# Patient Record
Sex: Male | Born: 1940 | Race: Black or African American | Hispanic: No | Smoking: Former smoker
Health system: Southern US, Community
[De-identification: ages and names within clinical notes are randomized; demographics above are authoritative.]

## PROBLEM LIST (undated history)

## (undated) DIAGNOSIS — E119 Type 2 diabetes mellitus without complications: Secondary | ICD-10-CM

## (undated) DIAGNOSIS — I10 Essential (primary) hypertension: Secondary | ICD-10-CM

## (undated) DIAGNOSIS — I248 Other forms of acute ischemic heart disease: Secondary | ICD-10-CM

## (undated) DIAGNOSIS — Z950 Presence of cardiac pacemaker: Secondary | ICD-10-CM

## (undated) DIAGNOSIS — N400 Enlarged prostate without lower urinary tract symptoms: Secondary | ICD-10-CM

## (undated) DIAGNOSIS — N289 Disorder of kidney and ureter, unspecified: Secondary | ICD-10-CM

## (undated) DIAGNOSIS — I2489 Other forms of acute ischemic heart disease: Secondary | ICD-10-CM

## (undated) DIAGNOSIS — I340 Nonrheumatic mitral (valve) insufficiency: Secondary | ICD-10-CM

## (undated) DIAGNOSIS — R001 Bradycardia, unspecified: Secondary | ICD-10-CM

## (undated) DIAGNOSIS — I5032 Chronic diastolic (congestive) heart failure: Secondary | ICD-10-CM

## (undated) DIAGNOSIS — I480 Paroxysmal atrial fibrillation: Secondary | ICD-10-CM

## (undated) DIAGNOSIS — I071 Rheumatic tricuspid insufficiency: Secondary | ICD-10-CM

## (undated) DIAGNOSIS — I519 Heart disease, unspecified: Secondary | ICD-10-CM

## (undated) DIAGNOSIS — E7800 Pure hypercholesterolemia, unspecified: Secondary | ICD-10-CM

## (undated) SURGERY — Surgical Case
Anesthesia: *Unknown

---

## 1999-04-02 ENCOUNTER — Ambulatory Visit (HOSPITAL_COMMUNITY): Admission: RE | Admit: 1999-04-02 | Discharge: 1999-04-02 | Payer: Self-pay | Admitting: Urology

## 1999-04-02 ENCOUNTER — Encounter: Payer: Self-pay | Admitting: Urology

## 2001-01-12 ENCOUNTER — Ambulatory Visit (HOSPITAL_COMMUNITY): Admission: RE | Admit: 2001-01-12 | Discharge: 2001-01-12 | Payer: Self-pay | Admitting: Gastroenterology

## 2001-06-08 ENCOUNTER — Encounter: Admission: RE | Admit: 2001-06-08 | Discharge: 2001-09-06 | Payer: Self-pay | Admitting: Internal Medicine

## 2001-08-02 ENCOUNTER — Ambulatory Visit (HOSPITAL_COMMUNITY): Admission: RE | Admit: 2001-08-02 | Discharge: 2001-08-02 | Payer: Self-pay | Admitting: Gastroenterology

## 2003-10-15 ENCOUNTER — Emergency Department (HOSPITAL_COMMUNITY): Admission: EM | Admit: 2003-10-15 | Discharge: 2003-10-15 | Payer: Self-pay | Admitting: Emergency Medicine

## 2011-09-11 DIAGNOSIS — I4891 Unspecified atrial fibrillation: Secondary | ICD-10-CM | POA: Diagnosis not present

## 2011-09-11 DIAGNOSIS — I10 Essential (primary) hypertension: Secondary | ICD-10-CM | POA: Diagnosis not present

## 2011-09-11 DIAGNOSIS — Z7901 Long term (current) use of anticoagulants: Secondary | ICD-10-CM | POA: Diagnosis not present

## 2011-09-18 DIAGNOSIS — Z23 Encounter for immunization: Secondary | ICD-10-CM | POA: Diagnosis not present

## 2011-09-18 DIAGNOSIS — I10 Essential (primary) hypertension: Secondary | ICD-10-CM | POA: Diagnosis not present

## 2011-09-18 DIAGNOSIS — I4891 Unspecified atrial fibrillation: Secondary | ICD-10-CM | POA: Diagnosis not present

## 2011-09-18 DIAGNOSIS — Z7901 Long term (current) use of anticoagulants: Secondary | ICD-10-CM | POA: Diagnosis not present

## 2011-10-13 DIAGNOSIS — I4891 Unspecified atrial fibrillation: Secondary | ICD-10-CM | POA: Diagnosis not present

## 2011-10-13 DIAGNOSIS — Z7901 Long term (current) use of anticoagulants: Secondary | ICD-10-CM | POA: Diagnosis not present

## 2011-11-12 DIAGNOSIS — R972 Elevated prostate specific antigen [PSA]: Secondary | ICD-10-CM | POA: Diagnosis not present

## 2011-11-12 DIAGNOSIS — N401 Enlarged prostate with lower urinary tract symptoms: Secondary | ICD-10-CM | POA: Diagnosis not present

## 2011-11-12 DIAGNOSIS — D303 Benign neoplasm of bladder: Secondary | ICD-10-CM | POA: Diagnosis not present

## 2011-11-25 DIAGNOSIS — R5383 Other fatigue: Secondary | ICD-10-CM | POA: Diagnosis not present

## 2011-11-25 DIAGNOSIS — E119 Type 2 diabetes mellitus without complications: Secondary | ICD-10-CM | POA: Diagnosis not present

## 2011-11-25 DIAGNOSIS — I10 Essential (primary) hypertension: Secondary | ICD-10-CM | POA: Diagnosis not present

## 2011-11-25 DIAGNOSIS — R05 Cough: Secondary | ICD-10-CM | POA: Diagnosis not present

## 2011-11-25 DIAGNOSIS — R5381 Other malaise: Secondary | ICD-10-CM | POA: Diagnosis not present

## 2011-11-30 DIAGNOSIS — Z7901 Long term (current) use of anticoagulants: Secondary | ICD-10-CM | POA: Diagnosis not present

## 2011-11-30 DIAGNOSIS — I4891 Unspecified atrial fibrillation: Secondary | ICD-10-CM | POA: Diagnosis not present

## 2011-12-02 DIAGNOSIS — R5381 Other malaise: Secondary | ICD-10-CM | POA: Diagnosis not present

## 2011-12-02 DIAGNOSIS — E119 Type 2 diabetes mellitus without complications: Secondary | ICD-10-CM | POA: Diagnosis not present

## 2011-12-02 DIAGNOSIS — R5383 Other fatigue: Secondary | ICD-10-CM | POA: Diagnosis not present

## 2011-12-02 DIAGNOSIS — E782 Mixed hyperlipidemia: Secondary | ICD-10-CM | POA: Diagnosis not present

## 2011-12-02 DIAGNOSIS — I4891 Unspecified atrial fibrillation: Secondary | ICD-10-CM | POA: Diagnosis not present

## 2011-12-18 DIAGNOSIS — Z7901 Long term (current) use of anticoagulants: Secondary | ICD-10-CM | POA: Diagnosis not present

## 2011-12-18 DIAGNOSIS — I4891 Unspecified atrial fibrillation: Secondary | ICD-10-CM | POA: Diagnosis not present

## 2012-02-24 DIAGNOSIS — E119 Type 2 diabetes mellitus without complications: Secondary | ICD-10-CM | POA: Diagnosis not present

## 2012-04-01 DIAGNOSIS — Z7901 Long term (current) use of anticoagulants: Secondary | ICD-10-CM | POA: Diagnosis not present

## 2012-04-01 DIAGNOSIS — I4891 Unspecified atrial fibrillation: Secondary | ICD-10-CM | POA: Diagnosis not present

## 2012-04-14 DIAGNOSIS — Z7901 Long term (current) use of anticoagulants: Secondary | ICD-10-CM | POA: Diagnosis not present

## 2012-04-14 DIAGNOSIS — I4891 Unspecified atrial fibrillation: Secondary | ICD-10-CM | POA: Diagnosis not present

## 2012-05-16 DIAGNOSIS — Z7901 Long term (current) use of anticoagulants: Secondary | ICD-10-CM | POA: Diagnosis not present

## 2012-05-16 DIAGNOSIS — I4891 Unspecified atrial fibrillation: Secondary | ICD-10-CM | POA: Diagnosis not present

## 2012-05-24 DIAGNOSIS — I4891 Unspecified atrial fibrillation: Secondary | ICD-10-CM | POA: Diagnosis not present

## 2012-05-24 DIAGNOSIS — K219 Gastro-esophageal reflux disease without esophagitis: Secondary | ICD-10-CM | POA: Diagnosis not present

## 2012-05-24 DIAGNOSIS — Z0000 Encounter for general adult medical examination without abnormal findings: Secondary | ICD-10-CM | POA: Diagnosis not present

## 2012-05-24 DIAGNOSIS — E782 Mixed hyperlipidemia: Secondary | ICD-10-CM | POA: Diagnosis not present

## 2012-05-24 DIAGNOSIS — E119 Type 2 diabetes mellitus without complications: Secondary | ICD-10-CM | POA: Diagnosis not present

## 2012-05-24 DIAGNOSIS — R5381 Other malaise: Secondary | ICD-10-CM | POA: Diagnosis not present

## 2012-05-31 DIAGNOSIS — I4891 Unspecified atrial fibrillation: Secondary | ICD-10-CM | POA: Diagnosis not present

## 2012-05-31 DIAGNOSIS — I10 Essential (primary) hypertension: Secondary | ICD-10-CM | POA: Diagnosis not present

## 2012-05-31 DIAGNOSIS — E119 Type 2 diabetes mellitus without complications: Secondary | ICD-10-CM | POA: Diagnosis not present

## 2012-05-31 DIAGNOSIS — H908 Mixed conductive and sensorineural hearing loss, unspecified: Secondary | ICD-10-CM | POA: Diagnosis not present

## 2012-05-31 DIAGNOSIS — E1129 Type 2 diabetes mellitus with other diabetic kidney complication: Secondary | ICD-10-CM | POA: Diagnosis not present

## 2012-05-31 DIAGNOSIS — E782 Mixed hyperlipidemia: Secondary | ICD-10-CM | POA: Diagnosis not present

## 2012-06-02 DIAGNOSIS — Z7901 Long term (current) use of anticoagulants: Secondary | ICD-10-CM | POA: Diagnosis not present

## 2012-06-02 DIAGNOSIS — I4891 Unspecified atrial fibrillation: Secondary | ICD-10-CM | POA: Diagnosis not present

## 2012-06-02 DIAGNOSIS — I10 Essential (primary) hypertension: Secondary | ICD-10-CM | POA: Diagnosis not present

## 2012-06-23 DIAGNOSIS — I4891 Unspecified atrial fibrillation: Secondary | ICD-10-CM | POA: Diagnosis not present

## 2012-06-23 DIAGNOSIS — I10 Essential (primary) hypertension: Secondary | ICD-10-CM | POA: Diagnosis not present

## 2012-06-23 DIAGNOSIS — Z7901 Long term (current) use of anticoagulants: Secondary | ICD-10-CM | POA: Diagnosis not present

## 2012-10-18 DIAGNOSIS — E119 Type 2 diabetes mellitus without complications: Secondary | ICD-10-CM | POA: Diagnosis not present

## 2012-10-18 DIAGNOSIS — E782 Mixed hyperlipidemia: Secondary | ICD-10-CM | POA: Diagnosis not present

## 2012-10-18 DIAGNOSIS — I10 Essential (primary) hypertension: Secondary | ICD-10-CM | POA: Diagnosis not present

## 2012-10-18 DIAGNOSIS — E1129 Type 2 diabetes mellitus with other diabetic kidney complication: Secondary | ICD-10-CM | POA: Diagnosis not present

## 2012-10-25 DIAGNOSIS — M5430 Sciatica, unspecified side: Secondary | ICD-10-CM | POA: Diagnosis not present

## 2012-10-25 DIAGNOSIS — E119 Type 2 diabetes mellitus without complications: Secondary | ICD-10-CM | POA: Diagnosis not present

## 2012-10-25 DIAGNOSIS — E782 Mixed hyperlipidemia: Secondary | ICD-10-CM | POA: Diagnosis not present

## 2012-10-25 DIAGNOSIS — I10 Essential (primary) hypertension: Secondary | ICD-10-CM | POA: Diagnosis not present

## 2012-11-17 DIAGNOSIS — I10 Essential (primary) hypertension: Secondary | ICD-10-CM | POA: Diagnosis not present

## 2012-12-22 DIAGNOSIS — N3941 Urge incontinence: Secondary | ICD-10-CM | POA: Diagnosis not present

## 2012-12-22 DIAGNOSIS — R972 Elevated prostate specific antigen [PSA]: Secondary | ICD-10-CM | POA: Diagnosis not present

## 2012-12-22 DIAGNOSIS — N401 Enlarged prostate with lower urinary tract symptoms: Secondary | ICD-10-CM | POA: Diagnosis not present

## 2012-12-22 DIAGNOSIS — D303 Benign neoplasm of bladder: Secondary | ICD-10-CM | POA: Diagnosis not present

## 2013-03-07 DIAGNOSIS — M5430 Sciatica, unspecified side: Secondary | ICD-10-CM | POA: Diagnosis not present

## 2013-03-07 DIAGNOSIS — I10 Essential (primary) hypertension: Secondary | ICD-10-CM | POA: Diagnosis not present

## 2013-03-07 DIAGNOSIS — E119 Type 2 diabetes mellitus without complications: Secondary | ICD-10-CM | POA: Diagnosis not present

## 2013-03-07 DIAGNOSIS — E782 Mixed hyperlipidemia: Secondary | ICD-10-CM | POA: Diagnosis not present

## 2013-03-14 DIAGNOSIS — E782 Mixed hyperlipidemia: Secondary | ICD-10-CM | POA: Diagnosis not present

## 2013-03-14 DIAGNOSIS — E119 Type 2 diabetes mellitus without complications: Secondary | ICD-10-CM | POA: Diagnosis not present

## 2013-03-14 DIAGNOSIS — I10 Essential (primary) hypertension: Secondary | ICD-10-CM | POA: Diagnosis not present

## 2013-03-14 DIAGNOSIS — I4891 Unspecified atrial fibrillation: Secondary | ICD-10-CM | POA: Diagnosis not present

## 2013-08-02 DIAGNOSIS — Z7901 Long term (current) use of anticoagulants: Secondary | ICD-10-CM | POA: Diagnosis not present

## 2013-08-02 DIAGNOSIS — I10 Essential (primary) hypertension: Secondary | ICD-10-CM | POA: Diagnosis not present

## 2013-08-02 DIAGNOSIS — Z0000 Encounter for general adult medical examination without abnormal findings: Secondary | ICD-10-CM | POA: Diagnosis not present

## 2013-08-02 DIAGNOSIS — Z1331 Encounter for screening for depression: Secondary | ICD-10-CM | POA: Diagnosis not present

## 2013-08-10 DIAGNOSIS — E782 Mixed hyperlipidemia: Secondary | ICD-10-CM | POA: Diagnosis not present

## 2013-08-10 DIAGNOSIS — E1129 Type 2 diabetes mellitus with other diabetic kidney complication: Secondary | ICD-10-CM | POA: Diagnosis not present

## 2013-08-10 DIAGNOSIS — Z0000 Encounter for general adult medical examination without abnormal findings: Secondary | ICD-10-CM | POA: Diagnosis not present

## 2013-08-10 DIAGNOSIS — I10 Essential (primary) hypertension: Secondary | ICD-10-CM | POA: Diagnosis not present

## 2013-08-10 DIAGNOSIS — E119 Type 2 diabetes mellitus without complications: Secondary | ICD-10-CM | POA: Diagnosis not present

## 2013-09-20 DIAGNOSIS — B353 Tinea pedis: Secondary | ICD-10-CM | POA: Diagnosis not present

## 2013-09-20 DIAGNOSIS — Q6689 Other  specified congenital deformities of feet: Secondary | ICD-10-CM | POA: Diagnosis not present

## 2013-09-20 DIAGNOSIS — E119 Type 2 diabetes mellitus without complications: Secondary | ICD-10-CM | POA: Diagnosis not present

## 2013-10-03 DIAGNOSIS — I10 Essential (primary) hypertension: Secondary | ICD-10-CM | POA: Diagnosis not present

## 2013-10-03 DIAGNOSIS — Z7901 Long term (current) use of anticoagulants: Secondary | ICD-10-CM | POA: Diagnosis not present

## 2013-10-03 DIAGNOSIS — I4891 Unspecified atrial fibrillation: Secondary | ICD-10-CM | POA: Diagnosis not present

## 2013-10-12 DIAGNOSIS — I4891 Unspecified atrial fibrillation: Secondary | ICD-10-CM | POA: Diagnosis not present

## 2013-10-12 DIAGNOSIS — I10 Essential (primary) hypertension: Secondary | ICD-10-CM | POA: Diagnosis not present

## 2013-10-12 DIAGNOSIS — Z7901 Long term (current) use of anticoagulants: Secondary | ICD-10-CM | POA: Diagnosis not present

## 2013-11-27 DIAGNOSIS — IMO0001 Reserved for inherently not codable concepts without codable children: Secondary | ICD-10-CM | POA: Diagnosis not present

## 2013-11-27 DIAGNOSIS — I10 Essential (primary) hypertension: Secondary | ICD-10-CM | POA: Diagnosis not present

## 2013-11-27 DIAGNOSIS — Z7901 Long term (current) use of anticoagulants: Secondary | ICD-10-CM | POA: Diagnosis not present

## 2013-11-27 DIAGNOSIS — I4891 Unspecified atrial fibrillation: Secondary | ICD-10-CM | POA: Diagnosis not present

## 2013-12-13 DIAGNOSIS — Z7901 Long term (current) use of anticoagulants: Secondary | ICD-10-CM | POA: Diagnosis not present

## 2013-12-13 DIAGNOSIS — E119 Type 2 diabetes mellitus without complications: Secondary | ICD-10-CM | POA: Diagnosis not present

## 2013-12-14 DIAGNOSIS — Z7901 Long term (current) use of anticoagulants: Secondary | ICD-10-CM | POA: Diagnosis not present

## 2013-12-14 DIAGNOSIS — I4891 Unspecified atrial fibrillation: Secondary | ICD-10-CM | POA: Diagnosis not present

## 2013-12-20 DIAGNOSIS — I4891 Unspecified atrial fibrillation: Secondary | ICD-10-CM | POA: Diagnosis not present

## 2013-12-20 DIAGNOSIS — IMO0001 Reserved for inherently not codable concepts without codable children: Secondary | ICD-10-CM | POA: Diagnosis not present

## 2013-12-20 DIAGNOSIS — I10 Essential (primary) hypertension: Secondary | ICD-10-CM | POA: Diagnosis not present

## 2013-12-20 DIAGNOSIS — E1129 Type 2 diabetes mellitus with other diabetic kidney complication: Secondary | ICD-10-CM | POA: Diagnosis not present

## 2013-12-26 DIAGNOSIS — M2040 Other hammer toe(s) (acquired), unspecified foot: Secondary | ICD-10-CM | POA: Diagnosis not present

## 2013-12-28 DIAGNOSIS — I10 Essential (primary) hypertension: Secondary | ICD-10-CM | POA: Diagnosis not present

## 2013-12-28 DIAGNOSIS — Z7901 Long term (current) use of anticoagulants: Secondary | ICD-10-CM | POA: Diagnosis not present

## 2013-12-28 DIAGNOSIS — I4891 Unspecified atrial fibrillation: Secondary | ICD-10-CM | POA: Diagnosis not present

## 2014-01-04 DIAGNOSIS — I4891 Unspecified atrial fibrillation: Secondary | ICD-10-CM | POA: Diagnosis not present

## 2014-01-04 DIAGNOSIS — Z7901 Long term (current) use of anticoagulants: Secondary | ICD-10-CM | POA: Diagnosis not present

## 2014-01-04 DIAGNOSIS — I10 Essential (primary) hypertension: Secondary | ICD-10-CM | POA: Diagnosis not present

## 2014-01-29 ENCOUNTER — Emergency Department (HOSPITAL_COMMUNITY): Payer: Medicare Other

## 2014-01-29 ENCOUNTER — Emergency Department (HOSPITAL_COMMUNITY)
Admission: EM | Admit: 2014-01-29 | Discharge: 2014-01-29 | Disposition: A | Discharge status: Home / Self Care | Payer: Medicare Other | Attending: Emergency Medicine | Admitting: Emergency Medicine

## 2014-01-29 ENCOUNTER — Encounter (HOSPITAL_COMMUNITY): Payer: Self-pay | Admitting: Emergency Medicine

## 2014-01-29 DIAGNOSIS — M11239 Other chondrocalcinosis, unspecified wrist: Secondary | ICD-10-CM | POA: Diagnosis not present

## 2014-01-29 DIAGNOSIS — E119 Type 2 diabetes mellitus without complications: Secondary | ICD-10-CM | POA: Diagnosis not present

## 2014-01-29 DIAGNOSIS — M25539 Pain in unspecified wrist: Secondary | ICD-10-CM | POA: Insufficient documentation

## 2014-01-29 DIAGNOSIS — Z87891 Personal history of nicotine dependence: Secondary | ICD-10-CM | POA: Diagnosis not present

## 2014-01-29 DIAGNOSIS — M25549 Pain in joints of unspecified hand: Secondary | ICD-10-CM | POA: Insufficient documentation

## 2014-01-29 DIAGNOSIS — M79641 Pain in right hand: Secondary | ICD-10-CM

## 2014-01-29 DIAGNOSIS — I10 Essential (primary) hypertension: Secondary | ICD-10-CM | POA: Insufficient documentation

## 2014-01-29 DIAGNOSIS — M19049 Primary osteoarthritis, unspecified hand: Secondary | ICD-10-CM | POA: Diagnosis not present

## 2014-01-29 DIAGNOSIS — M25449 Effusion, unspecified hand: Secondary | ICD-10-CM | POA: Insufficient documentation

## 2014-01-29 DIAGNOSIS — M25439 Effusion, unspecified wrist: Secondary | ICD-10-CM | POA: Insufficient documentation

## 2014-01-29 DIAGNOSIS — M25531 Pain in right wrist: Secondary | ICD-10-CM

## 2014-01-29 HISTORY — DX: Essential (primary) hypertension: I10

## 2014-01-29 HISTORY — DX: Type 2 diabetes mellitus without complications: E11.9

## 2014-01-29 HISTORY — DX: Pure hypercholesterolemia, unspecified: E78.00

## 2014-01-29 LAB — CBG MONITORING, ED: GLUCOSE-CAPILLARY: 138 mg/dL — AB (ref 70–99)

## 2014-01-29 MED ORDER — HYDROCODONE-ACETAMINOPHEN 5-325 MG PO TABS: 1.0000 | ORAL_TABLET | Freq: Four times a day (QID) | ORAL | Status: DC | PRN

## 2014-01-29 MED ORDER — TRAMADOL HCL 50 MG PO TABS: 50.0000 mg | ORAL_TABLET | Freq: Four times a day (QID) | ORAL | Status: DC | PRN

## 2014-01-29 NOTE — ED Notes (Signed)
Ortho tech at bedside to place thumb spica

## 2014-01-29 NOTE — Discharge Instructions (Signed)
Please call your doctor for a followup appointment within 24-48 hours. When you talk to your doctor please let them know that you were seen in the emergency department and have them acquire all of your records so that they can discuss the findings with you and formulate a treatment plan to fully care for your new and ongoing problems. Please call and set-up an appointment with hand specialist. Discussed Please avoid any physical or strenuous activity Please take pain medications as prescribed rash on pain medications his be no drinking alcohol, driving, operating any heavy machinery if there is extra please disposer proper manner. Please do not take any extra Tylenol for this can lead to Tylenol overdose and liver failure. Please rest, ice, elevate Please continue monitor symptoms closely and if symptoms are to worsen or change (fever greater than 1, chills, chest pain, shortness of breath, difficulty breathing, numbness, tingling, worsening or changes to pain pattern, red streaks running up the hands, swelling, to the touch, redness to the skin, weakness, fall, injury) please report back to the ED immediately   Wrist Pain Wrist injuries are frequent in adults and children. A sprain is an injury to the ligaments that hold your bones together. A strain is an injury to muscle or muscle cord-like structures (tendons) from stretching or pulling. Generally, when wrists are moderately tender to touch following a fall or injury, a break in the bone (fracture) may be present. Most wrist sprains or strains are better in 3 to 5 days, but complete healing may take several weeks. HOME CARE INSTRUCTIONS   Put ice on the injured area.  Put ice in a plastic bag.  Place a towel between your skin and the bag.  Leave the ice on for 15-20 minutes, 03-04 times a day, for the first 2 days.  Keep your arm raised above the level of your heart whenever possible to reduce swelling and pain.  Rest the injured area for at  least 48 hours or as directed by your caregiver.  If a splint or elastic bandage has been applied, use it for as long as directed by your caregiver or until seen by a caregiver for a follow-up exam.  Only take over-the-counter or prescription medicines for pain, discomfort, or fever as directed by your caregiver.  Keep all follow-up appointments. You may need to follow up with a specialist or have follow-up X-rays. Improvement in pain level is not a guarantee that you did not fracture a bone in your wrist. The only way to determine whether or not you have a broken bone is by X-ray. SEEK IMMEDIATE MEDICAL CARE IF:   Your fingers are swollen, very red, white, or cold and blue.  Your fingers are numb or tingling.  You have increasing pain.  You have difficulty moving your fingers. MAKE SURE YOU:   Understand these instructions.  Will watch your condition.  Will get help right away if you are not doing well or get worse. Document Released: 06/03/2005 Document Revised: 11/16/2011 Document Reviewed: 10/15/2010 [facility] Patient Information 2014 [place].

## 2014-01-29 NOTE — ED Notes (Signed)
Pt A+Ox4, presents with c/o R wrist pain and swelling to palm of hand.  Pt reports swelling x2 months, but pain started yesterday "i was lifting stuff".  Pt reports taking a vicodin last night with no relief.  Pt denies n/t to extremity.  +csm/+pulses.  Swelling noted to thumb area.  Pt denies other complaints.  NAD.

## 2014-01-29 NOTE — ED Provider Notes (Signed)
CSN: [id number]     Arrival date & time 01/29/14  1109 History   First MD Initiated Contact with Patient 01/29/14 1124     Chief Complaint  Patient presents with  . Wrist Pain     (Consider location/radiation/quality/duration/timing/severity/associated sxs/prior Treatment) The history is provided by the patient. No language interpreter was used.  Nicholas Harris is a 73 year old male with past medical history of diabetes, hypertension, hypercholesterolemia, atrial fibrillation on anticoagulation therapy presenting to the ED with right hand and wrist pain that started approximately 9:30 PM last night. Patient reports that over the past 2 months patient has been experiencing swelling to his right wrist and hand with intermittent tingling. Reported that pain worsens last night described as a dull, aching, throbbing sensation radiating from the right wrist down to the digits of the right hand. Stated that there is increased pain with motion as well as decreased grip secondary to pain. Stated that the pain worsens secondary to lifting buckets of water yesterday and transferring to vehicle. Reported that he took a Vicodin yesterday with minimal relief. Denied fall, injuries, trauma, numbness, tingling, weakness, red streaks, fever, chills. PCP Dr. Junius Creamer  Past Medical History  Diagnosis Date  . Diabetes mellitus without complication   . Hypertension   . High cholesterol   . A-fib    History reviewed. No pertinent past surgical history. No family history on file. History  Substance Use Topics  . Smoking status: Former Research scientist (life sciences)  . Smokeless tobacco: Not on file  . Alcohol Use: Yes     Comment: 2-3/wk    Review of Systems  Constitutional: Negative for fever and chills.  Musculoskeletal: Positive for arthralgias (Right hand and wrist pain) and joint swelling.  Neurological: Negative for weakness and numbness.  All other systems reviewed and are negative.     Allergies  Review of  patient's allergies indicates no known allergies.  Home Medications   Prior to Admission medications   Not on File   BP 139/77  Pulse 61  Temp(Src) 98.8 F (37.1 C) (Oral)  Resp 18  Ht 5\' 11"  (1.803 m)  Wt 250 lb (113.399 kg)  BMI 34.88 kg/m2  SpO2 99% Physical Exam  Nursing note and vitals reviewed. Constitutional: He is oriented to person, place, and time. He appears well-developed and well-nourished. No distress.  HENT:  Head: Normocephalic and atraumatic.  Eyes: Conjunctivae and EOM are normal. Right eye exhibits no discharge. Left eye exhibits no discharge.  Neck: Normal range of motion. Neck supple. No tracheal deviation present.  Cardiovascular: Normal rate, regular rhythm and normal heart sounds.  Exam reveals no friction rub.   No murmur heard. Pulses:      Radial pulses are 2+ on the right side, and 2+ on the left side.  Pulmonary/Chest: Effort normal and breath sounds normal. No respiratory distress. He has no wheezes. He has no rales.  Musculoskeletal: He exhibits edema and tenderness.       Right wrist: He exhibits decreased range of motion, tenderness and swelling. He exhibits no bony tenderness, no effusion, no crepitus, no deformity and no laceration.       Arms: Swelling noted to the dorsal aspect of the right hand and thenar region. Discomfort upon palpation to the thenar region of the right hand, positive snuffbox tenderness. Discomfort upon palpation to the right wrist circumferentially. Decreased ROM to the right wrist and digits of the right hand secondary to pain. Inability to make a fist secondary to pain. Pain  with flexion and extension of the right wrist. Pain with supination and pronation. Deformities identified to the right wrist.  Lymphadenopathy:    He has no cervical adenopathy.  Neurological: He is alert and oriented to person, place, and time. No cranial nerve deficit. He exhibits normal muscle tone. Coordination normal.  Skin: Skin is warm and dry.  No rash noted. He is not diaphoretic. No erythema.  Psychiatric: He has a normal mood and affect. His behavior is normal. Thought content normal.    ED Course  Procedures (including critical care time)  12:43 PM Dr. Tyrone Apple in-attending physician-in room assessing patient. As per attending physician recommended thumb spica splint and in consult to be performed.  1:01 PM This provider spoke with Dr. Lenon Curt - hand specialist - discussed case, physical exam, imaging. Recommended patient to be seen as outpatient. Agree to thumb spica.  Results for orders placed during the hospital encounter of 01/29/14  CBG MONITORING, ED      Result Value Ref Range   Glucose-Capillary 138 (*) 70 - 99 mg/dL     Labs Review Labs Reviewed  CBG MONITORING, ED - Abnormal; Notable for the following:    Glucose-Capillary 138 (*)    All other components within normal limits    Imaging Review Dg Wrist Complete Right  01/29/2014   CLINICAL DATA:  WRIST PAIN  EXAM: RIGHT WRIST - COMPLETE 3+ VIEW  COMPARISON:  None.  FINDINGS: Diffuse osteopenia. Carpal rows intact. Chondrocalcinosis in the TFCC. Negative for fracture or dislocation. No significant osseous degenerative change. Radial arterial calcifications noted at the wrist.  IMPRESSION: 1. Negative for fracture or other acute bone abnormality. 2. 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   Electronically Signed   By: Arne Cleveland M.D.   On: 01/29/2014 11:48   Dg Hand Complete Right  01/29/2014   CLINICAL DATA:  WRIST PAIN, no definite trauma  EXAM: RIGHT HAND - COMPLETE 3+ VIEW  COMPARISON:  None.  FINDINGS: Mild diffuse osteopenia. Normal alignment. Carpal rows intact. Chondrocalcinosis in the TFCC. Early marginal spurs about the DIP and PIP of the index finger. Vascular calcifications noted. Negative for fracture. No dislocation.  IMPRESSION: 1. Negative for fracture or other acute bone abnormality. 2. 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 3. 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   Electronically Signed   By: Arne Cleveland M.D.   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 Right hand plain film identified mild osteophyte as the DIP and PIP of the index finger. Decreased flexion and extension of the right wrist secondary to pain. Decreased range of motion to the digits secondary to pain. Thenar swelling with pain upon palpation-positive snuffbox tenderness. Patient seen and assessed by attending physician, Dr. Julious Oka who recommended patient to be placed in thumb spica. This provider spoke with hand specialist who agreed to thumb spica splint and recommended patient to be followed up as an outpatient. Snuffbox  tenderness identified to the right wrist. Decreased range of motion to the digits of the right hand secondary to pain. Decreased range of motion to the right wrist secondary to pain. Sensation intact. Patient neurovascularly intact. Strength intact MCP, PIP, DIP joints of the right hand. Pulses palpable and strong. Patient placed in thumb spica splint. Doubt septic joint. Doubt gout. Suspicion to be possible wrist sprain or tendon injury secondary to lifting of water yesterday. Patient seen and assessed by attending physician. Patient stable, afebrile. Discharged patient. Discharged patient with a small dose of pain medications. Referred patient to hand specialist and primary care provider. Discussed with patient to rest, ice, elevate. Discussed with patient to closely monitor symptoms and if symptoms are to  worsen or change to report back to the ED - strict return instructions given.  Patient agreed to plan of care, understood, all questions answered.   Jamse Mead, PA-C 01/29/14 2210

## 2014-01-30 NOTE — ED Provider Notes (Signed)
Medical screening examination/treatment/procedure(s) were conducted as a shared visit with non-physician practitioner(s) and myself.  I personally evaluated the patient during the encounter.   EKG Interpretation None      Patient here with R hand pain. Chronic thenar swelling, had R hand pain worsen after picking up a heavy object yesterday. Now with snuffbox tenderness, difficulty with wrist extension, wrist flexion, thumb adduction. He has poor effort with exam. Xray negative. With acuity of injury, no systemic symptoms, no fevers, no vomiting, no effusion - no concern for gout or septic arthritis. Instructed to f/u with Hand Surgery.  Osvaldo Shipper, MD 01/30/14 [phone]

## 2014-02-05 DIAGNOSIS — I4891 Unspecified atrial fibrillation: Secondary | ICD-10-CM | POA: Diagnosis not present

## 2014-02-05 DIAGNOSIS — Z7901 Long term (current) use of anticoagulants: Secondary | ICD-10-CM | POA: Diagnosis not present

## 2014-02-05 DIAGNOSIS — I10 Essential (primary) hypertension: Secondary | ICD-10-CM | POA: Diagnosis not present

## 2014-02-09 DIAGNOSIS — M25549 Pain in joints of unspecified hand: Secondary | ICD-10-CM | POA: Diagnosis not present

## 2014-03-08 DIAGNOSIS — I4891 Unspecified atrial fibrillation: Secondary | ICD-10-CM | POA: Diagnosis not present

## 2014-03-08 DIAGNOSIS — Z7901 Long term (current) use of anticoagulants: Secondary | ICD-10-CM | POA: Diagnosis not present

## 2014-03-13 DIAGNOSIS — N3941 Urge incontinence: Secondary | ICD-10-CM | POA: Diagnosis not present

## 2014-03-13 DIAGNOSIS — N138 Other obstructive and reflux uropathy: Secondary | ICD-10-CM | POA: Diagnosis not present

## 2014-03-13 DIAGNOSIS — N529 Male erectile dysfunction, unspecified: Secondary | ICD-10-CM | POA: Diagnosis not present

## 2014-03-13 DIAGNOSIS — N401 Enlarged prostate with lower urinary tract symptoms: Secondary | ICD-10-CM | POA: Diagnosis not present

## 2014-03-27 DIAGNOSIS — Z7901 Long term (current) use of anticoagulants: Secondary | ICD-10-CM | POA: Diagnosis not present

## 2014-03-27 DIAGNOSIS — I4891 Unspecified atrial fibrillation: Secondary | ICD-10-CM | POA: Diagnosis not present

## 2014-04-03 DIAGNOSIS — K5730 Diverticulosis of large intestine without perforation or abscess without bleeding: Secondary | ICD-10-CM | POA: Diagnosis not present

## 2014-04-03 DIAGNOSIS — Z1211 Encounter for screening for malignant neoplasm of colon: Secondary | ICD-10-CM | POA: Diagnosis not present

## 2014-04-03 DIAGNOSIS — K219 Gastro-esophageal reflux disease without esophagitis: Secondary | ICD-10-CM | POA: Diagnosis not present

## 2014-04-26 DIAGNOSIS — I4891 Unspecified atrial fibrillation: Secondary | ICD-10-CM | POA: Diagnosis not present

## 2014-04-26 DIAGNOSIS — Z7901 Long term (current) use of anticoagulants: Secondary | ICD-10-CM | POA: Diagnosis not present

## 2014-04-30 DIAGNOSIS — E782 Mixed hyperlipidemia: Secondary | ICD-10-CM | POA: Diagnosis not present

## 2014-04-30 DIAGNOSIS — IMO0001 Reserved for inherently not codable concepts without codable children: Secondary | ICD-10-CM | POA: Diagnosis not present

## 2014-05-02 DIAGNOSIS — I4891 Unspecified atrial fibrillation: Secondary | ICD-10-CM | POA: Diagnosis not present

## 2014-05-02 DIAGNOSIS — IMO0001 Reserved for inherently not codable concepts without codable children: Secondary | ICD-10-CM | POA: Diagnosis not present

## 2014-05-02 DIAGNOSIS — I10 Essential (primary) hypertension: Secondary | ICD-10-CM | POA: Diagnosis not present

## 2014-05-02 DIAGNOSIS — E782 Mixed hyperlipidemia: Secondary | ICD-10-CM | POA: Diagnosis not present

## 2014-05-29 DIAGNOSIS — Z7901 Long term (current) use of anticoagulants: Secondary | ICD-10-CM | POA: Diagnosis not present

## 2014-05-29 DIAGNOSIS — I4891 Unspecified atrial fibrillation: Secondary | ICD-10-CM | POA: Diagnosis not present

## 2014-06-08 DIAGNOSIS — Z1211 Encounter for screening for malignant neoplasm of colon: Secondary | ICD-10-CM | POA: Diagnosis not present

## 2014-06-08 DIAGNOSIS — K5730 Diverticulosis of large intestine without perforation or abscess without bleeding: Secondary | ICD-10-CM | POA: Diagnosis not present

## 2014-06-25 DIAGNOSIS — I4891 Unspecified atrial fibrillation: Secondary | ICD-10-CM | POA: Diagnosis not present

## 2014-06-25 DIAGNOSIS — Z7901 Long term (current) use of anticoagulants: Secondary | ICD-10-CM | POA: Diagnosis not present

## 2014-07-23 DIAGNOSIS — Z7901 Long term (current) use of anticoagulants: Secondary | ICD-10-CM | POA: Diagnosis not present

## 2014-07-23 DIAGNOSIS — I4891 Unspecified atrial fibrillation: Secondary | ICD-10-CM | POA: Diagnosis not present

## 2014-09-11 DIAGNOSIS — Z7901 Long term (current) use of anticoagulants: Secondary | ICD-10-CM | POA: Diagnosis not present

## 2014-09-11 DIAGNOSIS — I4891 Unspecified atrial fibrillation: Secondary | ICD-10-CM | POA: Diagnosis not present

## 2014-09-19 DIAGNOSIS — Z0000 Encounter for general adult medical examination without abnormal findings: Secondary | ICD-10-CM | POA: Diagnosis not present

## 2014-09-19 DIAGNOSIS — Z1389 Encounter for screening for other disorder: Secondary | ICD-10-CM | POA: Diagnosis not present

## 2014-09-24 DIAGNOSIS — E782 Mixed hyperlipidemia: Secondary | ICD-10-CM | POA: Diagnosis not present

## 2014-09-24 DIAGNOSIS — E1165 Type 2 diabetes mellitus with hyperglycemia: Secondary | ICD-10-CM | POA: Diagnosis not present

## 2014-09-24 DIAGNOSIS — I10 Essential (primary) hypertension: Secondary | ICD-10-CM | POA: Diagnosis not present

## 2014-09-24 DIAGNOSIS — N390 Urinary tract infection, site not specified: Secondary | ICD-10-CM | POA: Diagnosis not present

## 2014-09-24 DIAGNOSIS — M5430 Sciatica, unspecified side: Secondary | ICD-10-CM | POA: Diagnosis not present

## 2014-10-09 DIAGNOSIS — Z7901 Long term (current) use of anticoagulants: Secondary | ICD-10-CM | POA: Diagnosis not present

## 2014-10-09 DIAGNOSIS — I4891 Unspecified atrial fibrillation: Secondary | ICD-10-CM | POA: Diagnosis not present

## 2014-10-09 DIAGNOSIS — E559 Vitamin D deficiency, unspecified: Secondary | ICD-10-CM | POA: Diagnosis not present

## 2014-10-10 DIAGNOSIS — I10 Essential (primary) hypertension: Secondary | ICD-10-CM | POA: Diagnosis not present

## 2014-10-10 DIAGNOSIS — E119 Type 2 diabetes mellitus without complications: Secondary | ICD-10-CM | POA: Diagnosis not present

## 2014-10-10 DIAGNOSIS — H11159 Pinguecula, unspecified eye: Secondary | ICD-10-CM | POA: Diagnosis not present

## 2014-10-10 DIAGNOSIS — H25099 Other age-related incipient cataract, unspecified eye: Secondary | ICD-10-CM | POA: Diagnosis not present

## 2014-10-29 DIAGNOSIS — E1121 Type 2 diabetes mellitus with diabetic nephropathy: Secondary | ICD-10-CM | POA: Diagnosis not present

## 2014-10-29 DIAGNOSIS — E782 Mixed hyperlipidemia: Secondary | ICD-10-CM | POA: Diagnosis not present

## 2014-10-29 DIAGNOSIS — E1165 Type 2 diabetes mellitus with hyperglycemia: Secondary | ICD-10-CM | POA: Diagnosis not present

## 2014-10-29 DIAGNOSIS — I10 Essential (primary) hypertension: Secondary | ICD-10-CM | POA: Diagnosis not present

## 2014-11-06 DIAGNOSIS — I4891 Unspecified atrial fibrillation: Secondary | ICD-10-CM | POA: Diagnosis not present

## 2014-11-06 DIAGNOSIS — Z7901 Long term (current) use of anticoagulants: Secondary | ICD-10-CM | POA: Diagnosis not present

## 2014-11-08 DIAGNOSIS — Z202 Contact with and (suspected) exposure to infections with a predominantly sexual mode of transmission: Secondary | ICD-10-CM | POA: Diagnosis not present

## 2014-11-08 DIAGNOSIS — J060 Acute laryngopharyngitis: Secondary | ICD-10-CM | POA: Diagnosis not present

## 2014-11-08 DIAGNOSIS — I10 Essential (primary) hypertension: Secondary | ICD-10-CM | POA: Diagnosis not present

## 2014-12-06 DIAGNOSIS — Z7901 Long term (current) use of anticoagulants: Secondary | ICD-10-CM | POA: Diagnosis not present

## 2014-12-06 DIAGNOSIS — I4891 Unspecified atrial fibrillation: Secondary | ICD-10-CM | POA: Diagnosis not present

## 2014-12-06 DIAGNOSIS — E559 Vitamin D deficiency, unspecified: Secondary | ICD-10-CM | POA: Diagnosis not present

## 2015-02-20 ENCOUNTER — Emergency Department (HOSPITAL_COMMUNITY)
Admission: EM | Admit: 2015-02-20 | Discharge: 2015-02-20 | Disposition: A | Discharge status: Home / Self Care | Payer: Self-pay

## 2015-02-20 ENCOUNTER — Emergency Department (HOSPITAL_COMMUNITY)
Admission: EM | Admit: 2015-02-20 | Discharge: 2015-02-21 | Disposition: A | Discharge status: Home / Self Care | Payer: Medicare Other | Attending: Emergency Medicine | Admitting: Emergency Medicine

## 2015-02-20 ENCOUNTER — Emergency Department (HOSPITAL_COMMUNITY): Payer: Medicare Other

## 2015-02-20 ENCOUNTER — Encounter (HOSPITAL_COMMUNITY): Payer: Self-pay | Admitting: Emergency Medicine

## 2015-02-20 DIAGNOSIS — M47816 Spondylosis without myelopathy or radiculopathy, lumbar region: Secondary | ICD-10-CM | POA: Diagnosis not present

## 2015-02-20 DIAGNOSIS — R32 Unspecified urinary incontinence: Secondary | ICD-10-CM

## 2015-02-20 DIAGNOSIS — Z79899 Other long term (current) drug therapy: Secondary | ICD-10-CM | POA: Insufficient documentation

## 2015-02-20 DIAGNOSIS — M545 Low back pain, unspecified: Secondary | ICD-10-CM

## 2015-02-20 DIAGNOSIS — R05 Cough: Secondary | ICD-10-CM | POA: Diagnosis not present

## 2015-02-20 DIAGNOSIS — I4891 Unspecified atrial fibrillation: Secondary | ICD-10-CM | POA: Insufficient documentation

## 2015-02-20 DIAGNOSIS — N390 Urinary tract infection, site not specified: Secondary | ICD-10-CM | POA: Diagnosis not present

## 2015-02-20 DIAGNOSIS — Z7901 Long term (current) use of anticoagulants: Secondary | ICD-10-CM | POA: Diagnosis not present

## 2015-02-20 DIAGNOSIS — M9973 Connective tissue and disc stenosis of intervertebral foramina of lumbar region: Secondary | ICD-10-CM | POA: Diagnosis not present

## 2015-02-20 DIAGNOSIS — E119 Type 2 diabetes mellitus without complications: Secondary | ICD-10-CM | POA: Insufficient documentation

## 2015-02-20 DIAGNOSIS — I10 Essential (primary) hypertension: Secondary | ICD-10-CM | POA: Diagnosis not present

## 2015-02-20 DIAGNOSIS — M5186 Other intervertebral disc disorders, lumbar region: Secondary | ICD-10-CM | POA: Diagnosis not present

## 2015-02-20 DIAGNOSIS — M5136 Other intervertebral disc degeneration, lumbar region: Secondary | ICD-10-CM | POA: Insufficient documentation

## 2015-02-20 DIAGNOSIS — R0602 Shortness of breath: Secondary | ICD-10-CM | POA: Diagnosis not present

## 2015-02-20 DIAGNOSIS — E780 Pure hypercholesterolemia: Secondary | ICD-10-CM | POA: Insufficient documentation

## 2015-02-20 DIAGNOSIS — Z87891 Personal history of nicotine dependence: Secondary | ICD-10-CM | POA: Insufficient documentation

## 2015-02-20 DIAGNOSIS — M5146 Schmorl's nodes, lumbar region: Secondary | ICD-10-CM | POA: Diagnosis not present

## 2015-02-20 DIAGNOSIS — M519 Unspecified thoracic, thoracolumbar and lumbosacral intervertebral disc disorder: Secondary | ICD-10-CM

## 2015-02-20 LAB — URINALYSIS, ROUTINE W REFLEX MICROSCOPIC
Glucose, UA: NEGATIVE mg/dL
Ketones, ur: NEGATIVE mg/dL
Nitrite: NEGATIVE
PH: 5.5 (ref 5.0–8.0)
Protein, ur: 30 mg/dL — AB
Specific Gravity, Urine: 1.022 (ref 1.005–1.030)
UROBILINOGEN UA: 1 mg/dL (ref 0.0–1.0)

## 2015-02-20 LAB — URINE MICROSCOPIC-ADD ON

## 2015-02-20 LAB — CBC WITH DIFFERENTIAL/PLATELET
BASOS PCT: 0 % (ref 0–1)
Basophils Absolute: 0 10*3/uL (ref 0.0–0.1)
EOS PCT: 3 % (ref 0–5)
Eosinophils Absolute: 0.4 10*3/uL (ref 0.0–0.7)
HCT: 37.2 % — ABNORMAL LOW (ref 39.0–52.0)
HEMOGLOBIN: 12.3 g/dL — AB (ref 13.0–17.0)
LYMPHS PCT: 13 % (ref 12–46)
Lymphs Abs: 1.9 10*3/uL (ref 0.7–4.0)
MCH: 28.9 pg (ref 26.0–34.0)
MCHC: 33.1 g/dL (ref 30.0–36.0)
MCV: 87.5 fL (ref 78.0–100.0)
MONOS PCT: 10 % (ref 3–12)
Monocytes Absolute: 1.5 10*3/uL — ABNORMAL HIGH (ref 0.1–1.0)
Neutro Abs: 11 10*3/uL — ABNORMAL HIGH (ref 1.7–7.7)
Neutrophils Relative %: 74 % (ref 43–77)
PLATELETS: 188 10*3/uL (ref 150–400)
RBC: 4.25 MIL/uL (ref 4.22–5.81)
RDW: 13.5 % (ref 11.5–15.5)
WBC: 14.8 10*3/uL — AB (ref 4.0–10.5)

## 2015-02-20 LAB — COMPREHENSIVE METABOLIC PANEL
ALBUMIN: 4.2 g/dL (ref 3.5–5.0)
ALT: 78 U/L — ABNORMAL HIGH (ref 17–63)
AST: 44 U/L — AB (ref 15–41)
Alkaline Phosphatase: 84 U/L (ref 38–126)
Anion gap: 8 (ref 5–15)
BUN: 12 mg/dL (ref 6–20)
CALCIUM: 8.7 mg/dL — AB (ref 8.9–10.3)
CHLORIDE: 102 mmol/L (ref 101–111)
CO2: 24 mmol/L (ref 22–32)
CREATININE: 1.23 mg/dL (ref 0.61–1.24)
GFR calc Af Amer: >60 mL/min (ref >60)
GFR calc non Af Amer: 55 mL/min — ABNORMAL LOW (ref >60)
Glucose, Bld: 131 mg/dL — ABNORMAL HIGH (ref 65–99)
Potassium: 3.9 mmol/L (ref 3.5–5.1)
Sodium: 134 mmol/L — ABNORMAL LOW (ref 135–145)
TOTAL PROTEIN: 7.9 g/dL (ref 6.5–8.1)
Total Bilirubin: 1.8 mg/dL — ABNORMAL HIGH (ref 0.3–1.2)

## 2015-02-20 LAB — PROTIME-INR
INR: 1.75 — AB (ref 0.00–1.49)
Prothrombin Time: 20.4 seconds — ABNORMAL HIGH (ref 11.6–15.2)

## 2015-02-20 MED ORDER — GADOBENATE DIMEGLUMINE 529 MG/ML IV SOLN
20.0000 mL | Freq: Once | INTRAVENOUS | Status: AC | PRN
  Administered 2015-02-20: 20 mL via INTRAVENOUS

## 2015-02-20 MED ORDER — LORAZEPAM 2 MG/ML IJ SOLN
1.0000 mg | Freq: Once | INTRAMUSCULAR | Status: AC
  Administered 2015-02-20: 1 mg via INTRAVENOUS
  Filled 2015-02-20: qty 1

## 2015-02-20 NOTE — ED Provider Notes (Signed)
comPlains of low back pain rating to his right thigh for approximately one month. He reports that 2 days ago he was incontinent of urine several times. He is continent of urine today. He also had temperature 100.9 two days ago. He feels improved over he 2 days ago. Patient is alert nontoxic Glasgow Coma Score 15 abdomen soft nontender genitalia normal male back without point tenderness flank tenderness he has low back pain upon sitting up from a supine position. Gait is normal  Orlie Dakin, MD 02/20/15 2040

## 2015-02-20 NOTE — ED Notes (Signed)
Pt c/o cough x 3-4 wks (nonproductive) with urinary incontinence that has lasted around the same time.

## 2015-02-20 NOTE — Progress Notes (Signed)
EDCM spoke to patient at bedside.  Patient listed as having Medicare insurance without pcp.  Patient reports his pcp is Dr. Rogelia Mire of [place].  System updated.  EDPA at bedside currently.

## 2015-02-20 NOTE — ED Notes (Signed)
Patient transported to X-ray 

## 2015-02-20 NOTE — ED Provider Notes (Signed)
CSN: [id number]     Arrival date & time 02/20/15  1715 History   First MD Initiated Contact with Patient 02/20/15 1900     Chief Complaint  Patient presents with  . Cough  . Urinary Incontinence     (Consider location/radiation/quality/duration/timing/severity/associated sxs/prior Treatment) HPI Nicholas Harris is a 74 y.o. male with history of diabetes, hypertension, A. fib, presents to emergency for complaint of cough, right side pain and urinary incontinence for approximately 3 weeks. Patient states that incontinence comes and goes. States "I just put my pants." States this is associated with right lower back pain. States back pain is worse when he is moving around and walking. He denies any blood in his urine. No fever, but states he has had chills and night sweats. Patient also reports nonproductive cough for the same amount of time. Denies any incontinence during coughing. Denies any productive sputum. He has been taking his regular medications, no other medications taken for pain or symptoms.  Past Medical History  Diagnosis Date  . Diabetes mellitus without complication   . Hypertension   . High cholesterol   . A-fib    No past surgical history on file. No family history on file. History  Substance Use Topics  . Smoking status: Former Research scientist (life sciences)  . Smokeless tobacco: Not on file  . Alcohol Use: Yes     Comment: 2-3/wk    Review of Systems  Constitutional: Negative for fever and chills.  Respiratory: Positive for cough. Negative for chest tightness and shortness of breath.   Cardiovascular: Negative for chest pain, palpitations and leg swelling.  Gastrointestinal: Negative for nausea, vomiting, abdominal pain, diarrhea and abdominal distention.  Genitourinary: Positive for flank pain. Negative for dysuria, urgency, frequency, hematuria, discharge, penile swelling, scrotal swelling and testicular pain.       Positive for incontinence  Musculoskeletal: Negative for myalgias,  arthralgias, neck pain and neck stiffness.  Skin: Negative for rash.  Allergic/Immunologic: Negative for immunocompromised state.  Neurological: Negative for dizziness, weakness, light-headedness, numbness and headaches.      Allergies  Review of patient's allergies indicates no known allergies.  Home Medications   Prior to Admission medications   Medication Sig Start Date End Date Taking? Authorizing Provider  amLODipine (NORVASC) 5 MG tablet Take 5 mg by mouth daily.   Yes Historical Provider, MD  atorvastatin (LIPITOR) 20 MG tablet Take 20 mg by mouth at bedtime. 01/29/15  Yes Historical Provider, MD  finasteride (PROSCAR) 5 MG tablet Take 5 mg by mouth daily.   Yes Historical Provider, MD  glimepiride (AMARYL) 4 MG tablet Take 4 mg by mouth daily with breakfast.   Yes Historical Provider, MD  lisinopril (PRINIVIL,ZESTRIL) 20 MG tablet Take 20 mg by mouth daily.   Yes Historical Provider, MD  metFORMIN (GLUCOPHAGE-XR) 500 MG 24 hr tablet Take 1,000 mg by mouth 2 (two) times daily. 01/22/15  Yes Historical Provider, MD  tamsulosin (FLOMAX) 0.4 MG CAPS capsule Take 0.4 mg by mouth daily.   Yes Historical Provider, MD  warfarin (COUMADIN) 5 MG tablet Take 5 mg by mouth daily.   Yes Historical Provider, MD   BP 146/56 mmHg  Pulse 70  Temp(Src) 98.9 F (37.2 C) (Oral)  Resp 18  SpO2 96% Physical Exam  Constitutional: He is oriented to person, place, and time. He appears well-developed and well-nourished. No distress.  HENT:  Head: Normocephalic and atraumatic.  Eyes: Conjunctivae are normal.  Neck: Neck supple.  Cardiovascular: Normal rate, regular rhythm and  normal heart sounds.   Pulmonary/Chest: Effort normal. No respiratory distress. He has no wheezes. He has no rales.  Abdominal: Soft. Bowel sounds are normal. He exhibits no distension. There is no tenderness. There is no rebound.  No cva tenderness bilaterally  Musculoskeletal: He exhibits no edema.  No pain with right  straight leg raise  Neurological: He is alert and oriented to person, place, and time.  5/5 and equal lower extremity strength. 2+ and equal patellar reflexes bilaterally. Pt able to dorsiflex bilateral toes and feet with good strength against resistance. Equal sensation bilaterally over thighs and lower legs.   Skin: Skin is warm and dry.  Nursing note and vitals reviewed.   ED Course  Procedures (including critical care time) Labs Review Labs Reviewed  CBC WITH DIFFERENTIAL/PLATELET - Abnormal; Notable for the following:    WBC 14.8 (*)    Hemoglobin 12.3 (*)    HCT 37.2 (*)    Neutro Abs 11.0 (*)    Monocytes Absolute 1.5 (*)    All other components within normal limits  COMPREHENSIVE METABOLIC PANEL - Abnormal; Notable for the following:    Sodium 134 (*)    Glucose, Bld 131 (*)    Calcium 8.7 (*)    AST 44 (*)    ALT 78 (*)    Total Bilirubin 1.8 (*)    GFR calc non Af Amer 55 (*)    All other components within normal limits  URINALYSIS, ROUTINE W REFLEX MICROSCOPIC (NOT AT [facility]) - Abnormal; Notable for the following:    Color, Urine AMBER (*)    Hgb urine dipstick MODERATE (*)    Bilirubin Urine SMALL (*)    Protein, ur 30 (*)    Leukocytes, UA SMALL (*)    All other components within normal limits  URINE MICROSCOPIC-ADD ON - Abnormal; Notable for the following:    Bacteria, UA FEW (*)    Casts HYALINE CASTS (*)    All other components within normal limits    Imaging Review Dg Chest 2 View  02/20/2015   CLINICAL DATA:  Chronic dry cough and shortness of breath. Initial encounter.  EXAM: CHEST  2 VIEW  COMPARISON:  None.  FINDINGS: The lungs are well-aerated. Mild vascular congestion is noted. There is no evidence of focal opacification, pleural effusion or pneumothorax.  The heart is mildly enlarged. No acute osseous abnormalities are seen.  IMPRESSION: Mild vascular congestion noted. Mild cardiomegaly seen. The lungs remain grossly clear.   Electronically Signed    By: Garald Balding M.D.   On: 02/20/2015 19:15   Mr Lumbar Spine W Wo Contrast  02/21/2015   CLINICAL DATA:  Low back pain radiating to RIGHT thigh for 1 month. Urinary incontinence and low-grade fever 2 days ago, now improved. Low back pain. History of diabetes, hypertension, high cholesterol.  EXAM: MRI LUMBAR SPINE WITHOUT AND WITH CONTRAST  TECHNIQUE: Multiplanar and multiecho pulse sequences of the lumbar spine were obtained without and with intravenous contrast.  CONTRAST:  46mL MULTIHANCE GADOBENATE DIMEGLUMINE 529 MG/ML IV SOLN  COMPARISON:  None available for comparison at time of study interpretation.  FINDINGS: Mild motion degraded examination.  Lumbar vertebral bodies and posterior elements are intact and aligned and maintenance of lumbar lordosis. Using the reference level of the last well-formed intervertebral disc as L5-S1, moderate to severe L2-3, L5-S1 disc height loss, decreased T2 signal within the L3-4 disc consistent with mild to moderate desiccation, minimal desiccation remaining lumbar discs. Low T1, bright  STIR signal with enhancement within the RIGHT L5 posterior superior endplate likely represents an acute Schmorl's node. Additional acute to subacute Schmorl's node L2 inferior endplate. No abnormal STIR signal to suggest fracture. Moderate subacute to chronic discogenic endplate changes W2-9 through L4-5, moderate to severe at L5-S1.  Conus medullaris terminates at T12-L1 and appears normal morphology and signal characteristics. Cauda equina is unremarkable. No abnormal cord, leptomeningeal nor epidural enhancement/fluid collections. Mild symmetric paraspinal muscle atrophy. Bright T1, low T2 23 mm lesion RIGHT kidney partially imaged.  Level by level evaluation:  T12-L1: No disc bulge, canal stenosis nor neural foraminal narrowing.  L1-2: Far LEFT lateral broad-based disc protrusion encroaches upon the exited LEFT L1 nerve, the protrusion is this likely ossified. No canal stenosis or  neural foraminal narrowing.  L2-3: 3 mm broad-based disc bulge, mild facet arthropathy and ligamentum flavum redundancy with trace facet effusions which are likely reactive. Mild canal stenosis, partial effacement LEFT lateral recess may affect the traversing LEFT L3 nerve. Mild bilateral neural foraminal narrowing, LEFT greater than RIGHT.  L3-4: Minimal annular bulging, mild facet arthropathy and ligamentum flavum redundancy without canal stenosis. Mild bilateral neural foraminal narrowing.  L4-5: 2 mm broad-based disc bulge, superimposed RIGHT subarticular extra foraminal disc protrusion encroaches upon the exited RIGHT L4 nerve. Mild facet arthropathy on the RIGHT, with trace facet effusion which is likely reactive. No canal stenosis. Severe RIGHT, mild LEFT neural foraminal narrowing.  L5-S1: 3 mm broad-based disc bulge asymmetric to the RIGHT encroaches upon the exited RIGHT L5 nerve. Mild RIGHT facet arthropathy without canal stenosis. Moderate to severe bilateral neural foraminal narrowing.  IMPRESSION: No acute fracture, malalignment or suspicious enhancement on this mildly motion degraded examination.  Acute L5 superior endplate Schmorl's node, acute to subacute L2 inferior endplate Schmorl's node.  Degenerative change of the lumbar spine resulting an mild canal stenosis at L2-3.  Neural foraminal narrowing L2-3 through L5-S1: Severe on the RIGHT at L4-5, moderate to severe bilaterally at L5-S1.  23 mm lesion RIGHT kidney with imaging characteristics of hemorrhagic cyst though, recommend follow-up.   Electronically Signed   By: Elon Alas M.D.   On: 02/21/2015 01:24     EKG Interpretation None      MDM   Final diagnoses:  Incontinence  Right-sided low back pain without sciatica  UTI (lower urinary tract infection)  Intervertebral disk disease    patient with right back pain, urinary incontinence, cough. His chest x-ray is negative. Vital signs are normal. His symptoms are concerning  for possible cauda equina. I discussed with Dr. Cathleen Fears was seen the patient. He agrees he will need MRI for further evaluation. He has no other exam findings. He is afebrile. Nontoxic appearing. Urinalysis that showed a few bacteria, could also be related to a UTI versus pyelonephritis. MRI is ordered for further evaluation.   1:40 AM MRI is as described above. Findings discussed with patient and his family. Dr. Claudine Mouton reviewed results, most likely not cuada equina. Outpatient follow up. Discussed kidney cyst, will need primary care doctor follow-up. Will start on keflex for uti - small leukocytes 7-10 wbcs, few bacteria. Follow up with neurosurgery and urology.   Filed Vitals:   02/20/15 1722 02/20/15 2008 02/20/15 2215 02/21/15 0037  BP: 146/56 122/51 134/71 123/61  Pulse: 70 72 90 83  Temp: 98.9 F (37.2 C)     TempSrc: Oral     Resp: 18 18 18 18   SpO2: 96% 96% 96% 94%  Jeannett Senior, PA-C 02/21/15 0143  Orlie Dakin, MD 02/21/15 [phone]

## 2015-02-20 NOTE — ED Notes (Signed)
Bed: ES97 Expected date:  Expected time:  Means of arrival:  Comments: Hold for [employer]

## 2015-02-20 NOTE — ED Notes (Signed)
Patient transported to MRI 

## 2015-02-21 DIAGNOSIS — M47816 Spondylosis without myelopathy or radiculopathy, lumbar region: Secondary | ICD-10-CM | POA: Diagnosis not present

## 2015-02-21 DIAGNOSIS — M5146 Schmorl's nodes, lumbar region: Secondary | ICD-10-CM | POA: Diagnosis not present

## 2015-02-21 DIAGNOSIS — I10 Essential (primary) hypertension: Secondary | ICD-10-CM | POA: Diagnosis not present

## 2015-02-21 DIAGNOSIS — M9973 Connective tissue and disc stenosis of intervertebral foramina of lumbar region: Secondary | ICD-10-CM | POA: Diagnosis not present

## 2015-02-21 MED ORDER — CEPHALEXIN 500 MG PO CAPS: 500.0000 mg | ORAL_CAPSULE | Freq: Four times a day (QID) | ORAL | Status: DC

## 2015-02-21 MED ORDER — HYDROCODONE-ACETAMINOPHEN 5-325 MG PO TABS: 1.0000 | ORAL_TABLET | ORAL | Status: DC | PRN

## 2015-02-21 NOTE — Discharge Instructions (Signed)
Take norco as prescribed as needed for severe pain. Keflex as prescribed until all gone for bladder infection. Follow up with Dr. Risa Grill and Dr. Ronnald Ramp as referred. Return if worsening symptoms.   Urinary Incontinence Urinary incontinence is the involuntary loss of urine from your bladder. CAUSES  There are many causes of urinary incontinence. They include:  Medicines.  Infections.  Prostatic enlargement, leading to overflow of urine from your bladder.  Surgery.  Neurological diseases.  Emotional factors. SIGNS AND SYMPTOMS Urinary Incontinence can be divided into four types:  Urge incontinence. Urge incontinence is the involuntary loss of urine before you have the opportunity to go to the bathroom. There is a sudden urge to void but not enough time to reach a bathroom.  Stress incontinence. Stress incontinence is the sudden loss of urine with any activity that forces urine to pass. It is commonly caused by anatomical changes to the pelvis and sphincter areas of your body.  Overflow incontinence. Overflow incontinence is the loss of urine from an obstructed opening to your bladder. This results in a backup of urine and a resultant buildup of pressure within the bladder. When the pressure within the bladder exceeds the closing pressure of the sphincter, the urine overflows, which causes incontinence, similar to water overflowing a dam.  Total incontinence. Total incontinence is the loss of urine as a result of the inability to store urine within your bladder. DIAGNOSIS  Evaluating the cause of incontinence may require:  A thorough and complete medical and obstetric history.  A complete physical exam.  Laboratory tests such as a urine culture and sensitivities. When additional tests are indicated, they can include:  An ultrasound exam.  Kidney and bladder X-rays.  Cystoscopy. This is an exam of the bladder using a narrow scope.  Urodynamic testing to test the nerve function  to the bladder and sphincter areas. TREATMENT  Treatment for urinary incontinence depends on the cause:  For urge incontinence caused by a bacterial infection, antibiotics will be prescribed. If the urge incontinence is related to medicines you take, your health care provider may have you change the medicine.  For stress incontinence, surgery to re-establish anatomical support to the bladder or sphincter, or both, will often correct the condition.  For overflow incontinence caused by an enlarged prostate, an operation to open the channel through the enlarged prostate will allow the flow of urine out of the bladder. In women with fibroids, a hysterectomy may be recommended.  For total incontinence, surgery on your urinary sphincter may help. An artificial urinary sphincter (an inflatable cuff placed around the urethra) may be required. In women who have developed a hole-like passage between their bladder and vagina (vesicovaginal fistula), surgery to close the fistula often is required. HOME CARE INSTRUCTIONS  Normal daily hygiene and the use of pads or adult diapers that are changed regularly will help prevent odors and skin damage.  Avoid caffeine. It can overstimulate your bladder.  Use the bathroom regularly. Try about every 2-3 hours to go to the bathroom, even if you do not feel the need to do so. Take time to empty your bladder completely. After urinating, wait a minute. Then try to urinate again.  For causes involving nerve dysfunction, keep a log of the medicines you take and a journal of the times you go to the bathroom. SEEK MEDICAL CARE IF:  You experience worsening of pain instead of improvement in pain after your procedure.  Your incontinence becomes worse instead of better. SEE  IMMEDIATE MEDICAL CARE IF:  You experience fever or shaking chills.  You are unable to pass your urine.  You have redness spreading into your groin or down into your thighs. MAKE SURE YOU:    Understand these instructions.   Will watch your condition.  Will get help right away if you are not doing well or get worse. Document Released: 10/01/2004 Document Revised: 06/14/2013 Document Reviewed: 01/31/2013 [facility] Patient Information 2015 [place], [place]. This information is not intended to replace advice given to you by your health care provider. Make sure you discuss any questions you have with your health care provider.  Herniated Disk A herniated disk occurs when a disk in your spine bulges out too far. This condition is also called a ruptured disk or slipped disk. Your spine (backbone) is made up of bones called vertebrae. Between each pair of vertebrae is an oval disk with a soft, spongy center that acts as a shock absorber when you move. The spongy center is surrounded by a tough outer ring. When you have a herniated disk, the spongy center of the disk bulges out or ruptures through the outer ring. A herniated disk can press on a nerve between your vertebrae and cause pain. A herniated disk can occur anywhere in your back or neck area, but the lower back is the most common spot. CAUSES  In many cases, a herniated disk occurs just from getting older. As you age, the spongy insides of your disks tend to shrink and dry out. A herniated disk can result from gradual wear and tear. Injury or sudden strain can also cause a herniated disk.  RISK FACTORS Aging is the main risk factor for a herniated disk. Other risk factors include:  Being a man between the ages of 72 and 72 years.  Having a job that requires heavy lifting, bending, or twisting.  Having a job that requires long hours of driving.  Not getting enough exercise.  Being overweight.  Smoking. SIGNS AND SYMPTOMS  Signs and symptoms depend on which disk is herniated.  For a herniated disk in the lower back, you may have sharp pain in:  One part of your leg, hip, or buttocks.  The back of your calf.  The top or  sole of your foot (sciatica).   For a herniated disk in the neck, you may feel pain:  When you move your neck.  Near or over your shoulder blade.  That moves to your upper arm, forearm, or fingers.   You may also have muscle weakness. It may be hard to:  Lift your leg or arm.  Stand on your toes.  Squeeze tightly with one of your hands.  Other symptoms can include:  Numbness or tingling in the affected areas of your body.  Loss of bladder or bowel control. This is a rare but serious sign of a severe herniated disk in the lower back. DIAGNOSIS  Your health care provider will do a physical exam. During this exam, you may have to move certain body parts or assume various positions. For example, your health care provider may do the straight-leg test. This is a good way to test for a herniated disk in your lower back. In this test, the health care provider lifts your leg while you lie on your back. This is to see if you feel pain down your leg. Your health care provider will also check for numbness or loss of feeling.  Your health care provider will also check  your:  Reflexes.  Muscle strength.  Posture.  Other tests may be done to help in making a diagnosis. These may include:  An X-ray of the spine to rule out other causes of back pain.   Other imaging studies, such as an MRI or CT scan. This is to check whether the herniated disk is pressing on your spinal canal.  Electromyography (EMG). This test checks the nerves that control muscles. It is sometimes used to identify the specific area of nerve involvement.  TREATMENT  In many cases, herniated disk symptoms go away over a period of days or weeks. You will most likely be free of symptoms in 3-4 months. Treatment may include the following:  The initial treatment for a herniated disk is ashort period of rest.  Bed rest is often limited to 1 or 2 days. Resting for too long delays recovery.  If you have a herniated disk  in your lower back, you should avoid sitting as much as possible because sitting increases pressure on the disk.  Medicines. These may include:   Nonsteroidal anti-inflammatory drugs (NSAIDs).  Muscle relaxants for back spasms.  Narcotic pain medicine if your pain is very bad.   Steroid injections. You may need these along the involved nerve root to help control pain. The steroid is injected in the area of the herniated disk. It helps by reducing swelling around the disk.  Physical therapy. This may include exercises to strengthen the muscles that help support your spine.   You may need surgery if other treatments do not work.  HOME CARE INSTRUCTIONS Follow all your health care provider's instructions. These may include:  Take all medicines as directed by your health care provider.  Rest for 2 days and then start moving.  Do not sit or stand for long periods of time.  Maintain good posture when sitting and standing.  Avoid movements that cause pain, such as bending or lifting.  When you are able to start lifting things again:  [place] with your knees.  Keep your back straight.  Hold heavy objects close to your body.  If you are overweight, ask your health care provider to help you start a weight-loss program.  When you are able to start exercising, ask your health care provider how much and what type of exercise is best for you.  Work with a physical therapist on stretching and strengthening exercises for your back.  Do not wear high-heeled shoes.  Do not sleep on your belly.  Do not smoke.  Keep all follow-up visits as directed by your health care provider. SEEK MEDICAL CARE IF:  You have back or neck pain that is not getting better after 4 weeks.  You have very bad pain in your back or neck.  You develop numbness, tingling, or weakness along with pain. SEEK IMMEDIATE MEDICAL CARE IF:   You have numbness, tingling, or weakness that makes you unable to use  your arms or legs.  You lose control of your bladder or bowels.  You have dizziness or fainting.  You have shortness of breath.  MAKE SURE YOU:   Understand these instructions.  Will watch your condition.  Will get help right away if you are not doing well or get worse. Document Released: 08/21/2000 Document Revised: 01/08/2014 Document Reviewed: 07/28/2013 [facility] Patient Information 2015 [place], [place]. This information is not intended to replace advice given to you by your health care provider. Make sure you discuss any questions you have with your health  care provider.  

## 2015-02-22 DIAGNOSIS — E782 Mixed hyperlipidemia: Secondary | ICD-10-CM | POA: Diagnosis not present

## 2015-02-22 DIAGNOSIS — E1165 Type 2 diabetes mellitus with hyperglycemia: Secondary | ICD-10-CM | POA: Diagnosis not present

## 2015-02-22 LAB — URINE CULTURE: Culture: NO GROWTH

## 2015-02-26 DIAGNOSIS — Z6835 Body mass index (BMI) 35.0-35.9, adult: Secondary | ICD-10-CM | POA: Diagnosis not present

## 2015-02-26 DIAGNOSIS — M5136 Other intervertebral disc degeneration, lumbar region: Secondary | ICD-10-CM | POA: Diagnosis not present

## 2015-03-06 DIAGNOSIS — I4891 Unspecified atrial fibrillation: Secondary | ICD-10-CM | POA: Diagnosis not present

## 2015-03-06 DIAGNOSIS — E782 Mixed hyperlipidemia: Secondary | ICD-10-CM | POA: Diagnosis not present

## 2015-03-06 DIAGNOSIS — I10 Essential (primary) hypertension: Secondary | ICD-10-CM | POA: Diagnosis not present

## 2015-03-06 DIAGNOSIS — E1165 Type 2 diabetes mellitus with hyperglycemia: Secondary | ICD-10-CM | POA: Diagnosis not present

## 2015-03-07 DIAGNOSIS — Z7901 Long term (current) use of anticoagulants: Secondary | ICD-10-CM | POA: Diagnosis not present

## 2015-03-07 DIAGNOSIS — I4891 Unspecified atrial fibrillation: Secondary | ICD-10-CM | POA: Diagnosis not present

## 2015-03-07 DIAGNOSIS — E559 Vitamin D deficiency, unspecified: Secondary | ICD-10-CM | POA: Diagnosis not present

## 2015-04-04 DIAGNOSIS — Z7901 Long term (current) use of anticoagulants: Secondary | ICD-10-CM | POA: Diagnosis not present

## 2015-04-04 DIAGNOSIS — I4891 Unspecified atrial fibrillation: Secondary | ICD-10-CM | POA: Diagnosis not present

## 2015-04-08 DIAGNOSIS — I4891 Unspecified atrial fibrillation: Secondary | ICD-10-CM | POA: Diagnosis not present

## 2015-04-08 DIAGNOSIS — Z7901 Long term (current) use of anticoagulants: Secondary | ICD-10-CM | POA: Diagnosis not present

## 2015-04-15 DIAGNOSIS — R312 Other microscopic hematuria: Secondary | ICD-10-CM | POA: Diagnosis not present

## 2015-04-15 DIAGNOSIS — N3941 Urge incontinence: Secondary | ICD-10-CM | POA: Diagnosis not present

## 2015-04-15 DIAGNOSIS — N138 Other obstructive and reflux uropathy: Secondary | ICD-10-CM | POA: Diagnosis not present

## 2015-04-15 DIAGNOSIS — N401 Enlarged prostate with lower urinary tract symptoms: Secondary | ICD-10-CM | POA: Diagnosis not present

## 2015-04-15 DIAGNOSIS — N281 Cyst of kidney, acquired: Secondary | ICD-10-CM | POA: Diagnosis not present

## 2015-05-07 DIAGNOSIS — N138 Other obstructive and reflux uropathy: Secondary | ICD-10-CM | POA: Diagnosis not present

## 2015-05-07 DIAGNOSIS — N281 Cyst of kidney, acquired: Secondary | ICD-10-CM | POA: Diagnosis not present

## 2015-05-07 DIAGNOSIS — N401 Enlarged prostate with lower urinary tract symptoms: Secondary | ICD-10-CM | POA: Diagnosis not present

## 2015-05-09 DIAGNOSIS — Z7901 Long term (current) use of anticoagulants: Secondary | ICD-10-CM | POA: Diagnosis not present

## 2015-05-09 DIAGNOSIS — I4891 Unspecified atrial fibrillation: Secondary | ICD-10-CM | POA: Diagnosis not present

## 2015-06-06 DIAGNOSIS — Z7901 Long term (current) use of anticoagulants: Secondary | ICD-10-CM | POA: Diagnosis not present

## 2015-06-06 DIAGNOSIS — I4891 Unspecified atrial fibrillation: Secondary | ICD-10-CM | POA: Diagnosis not present

## 2015-06-07 DIAGNOSIS — R05 Cough: Secondary | ICD-10-CM | POA: Diagnosis not present

## 2015-06-07 DIAGNOSIS — J22 Unspecified acute lower respiratory infection: Secondary | ICD-10-CM | POA: Diagnosis not present

## 2015-06-07 DIAGNOSIS — R0602 Shortness of breath: Secondary | ICD-10-CM | POA: Diagnosis not present

## 2015-06-07 DIAGNOSIS — J0100 Acute maxillary sinusitis, unspecified: Secondary | ICD-10-CM | POA: Diagnosis not present

## 2015-06-13 ENCOUNTER — Other Ambulatory Visit: Payer: Self-pay | Admitting: Internal Medicine

## 2015-06-13 DIAGNOSIS — R0602 Shortness of breath: Secondary | ICD-10-CM

## 2015-06-13 DIAGNOSIS — R05 Cough: Secondary | ICD-10-CM

## 2015-06-13 DIAGNOSIS — R059 Cough, unspecified: Secondary | ICD-10-CM

## 2015-06-18 DIAGNOSIS — E1165 Type 2 diabetes mellitus with hyperglycemia: Secondary | ICD-10-CM | POA: Diagnosis not present

## 2015-06-18 DIAGNOSIS — I10 Essential (primary) hypertension: Secondary | ICD-10-CM | POA: Diagnosis not present

## 2015-06-18 DIAGNOSIS — E782 Mixed hyperlipidemia: Secondary | ICD-10-CM | POA: Diagnosis not present

## 2015-06-18 DIAGNOSIS — I4891 Unspecified atrial fibrillation: Secondary | ICD-10-CM | POA: Diagnosis not present

## 2015-06-19 ENCOUNTER — Inpatient Hospital Stay: Admission: RE | Admit: 2015-06-19 | Payer: Medicare Other | Source: Ambulatory Visit

## 2015-06-24 ENCOUNTER — Ambulatory Visit
Admission: RE | Admit: 2015-06-24 | Discharge: 2015-06-24 | Disposition: A | Discharge status: Home / Self Care | Payer: Medicare Other | Source: Ambulatory Visit | Attending: Internal Medicine | Admitting: Internal Medicine

## 2015-06-24 DIAGNOSIS — R059 Cough, unspecified: Secondary | ICD-10-CM

## 2015-06-24 DIAGNOSIS — R0602 Shortness of breath: Secondary | ICD-10-CM

## 2015-06-24 DIAGNOSIS — R05 Cough: Secondary | ICD-10-CM | POA: Diagnosis not present

## 2015-06-24 MED ORDER — IOPAMIDOL (ISOVUE-300) INJECTION 61%: 125.0000 mL | Freq: Once | INTRAVENOUS | Status: DC | PRN

## 2015-06-27 DIAGNOSIS — E1165 Type 2 diabetes mellitus with hyperglycemia: Secondary | ICD-10-CM | POA: Diagnosis not present

## 2015-06-27 DIAGNOSIS — I10 Essential (primary) hypertension: Secondary | ICD-10-CM | POA: Diagnosis not present

## 2015-06-27 DIAGNOSIS — E782 Mixed hyperlipidemia: Secondary | ICD-10-CM | POA: Diagnosis not present

## 2015-06-27 DIAGNOSIS — E1121 Type 2 diabetes mellitus with diabetic nephropathy: Secondary | ICD-10-CM | POA: Diagnosis not present

## 2015-06-27 DIAGNOSIS — Z23 Encounter for immunization: Secondary | ICD-10-CM | POA: Diagnosis not present

## 2015-08-27 DIAGNOSIS — Z7901 Long term (current) use of anticoagulants: Secondary | ICD-10-CM | POA: Diagnosis not present

## 2015-08-27 DIAGNOSIS — I4891 Unspecified atrial fibrillation: Secondary | ICD-10-CM | POA: Diagnosis not present

## 2015-09-24 DIAGNOSIS — I4891 Unspecified atrial fibrillation: Secondary | ICD-10-CM | POA: Diagnosis not present

## 2015-09-24 DIAGNOSIS — Z7901 Long term (current) use of anticoagulants: Secondary | ICD-10-CM | POA: Diagnosis not present

## 2015-10-11 DIAGNOSIS — N281 Cyst of kidney, acquired: Secondary | ICD-10-CM | POA: Diagnosis not present

## 2015-10-11 DIAGNOSIS — N401 Enlarged prostate with lower urinary tract symptoms: Secondary | ICD-10-CM | POA: Diagnosis not present

## 2015-10-11 DIAGNOSIS — N138 Other obstructive and reflux uropathy: Secondary | ICD-10-CM | POA: Diagnosis not present

## 2015-10-11 DIAGNOSIS — Z0000 Encounter for general adult medical examination without abnormal findings: Secondary | ICD-10-CM | POA: Diagnosis not present

## 2015-10-11 DIAGNOSIS — N3941 Urge incontinence: Secondary | ICD-10-CM | POA: Diagnosis not present

## 2015-10-11 DIAGNOSIS — R3129 Other microscopic hematuria: Secondary | ICD-10-CM | POA: Diagnosis not present

## 2015-10-22 DIAGNOSIS — R3129 Other microscopic hematuria: Secondary | ICD-10-CM | POA: Diagnosis not present

## 2015-10-30 DIAGNOSIS — N281 Cyst of kidney, acquired: Secondary | ICD-10-CM | POA: Diagnosis not present

## 2015-10-30 DIAGNOSIS — R3129 Other microscopic hematuria: Secondary | ICD-10-CM | POA: Diagnosis not present

## 2015-10-30 DIAGNOSIS — Z0000 Encounter for general adult medical examination without abnormal findings: Secondary | ICD-10-CM | POA: Diagnosis not present

## 2015-11-04 DIAGNOSIS — I4891 Unspecified atrial fibrillation: Secondary | ICD-10-CM | POA: Diagnosis not present

## 2015-11-04 DIAGNOSIS — Z7901 Long term (current) use of anticoagulants: Secondary | ICD-10-CM | POA: Diagnosis not present

## 2015-12-02 DIAGNOSIS — Z7901 Long term (current) use of anticoagulants: Secondary | ICD-10-CM | POA: Diagnosis not present

## 2015-12-02 DIAGNOSIS — I4891 Unspecified atrial fibrillation: Secondary | ICD-10-CM | POA: Diagnosis not present

## 2015-12-05 DIAGNOSIS — Z125 Encounter for screening for malignant neoplasm of prostate: Secondary | ICD-10-CM | POA: Diagnosis not present

## 2015-12-05 DIAGNOSIS — E559 Vitamin D deficiency, unspecified: Secondary | ICD-10-CM | POA: Diagnosis not present

## 2015-12-05 DIAGNOSIS — I10 Essential (primary) hypertension: Secondary | ICD-10-CM | POA: Diagnosis not present

## 2015-12-05 DIAGNOSIS — E782 Mixed hyperlipidemia: Secondary | ICD-10-CM | POA: Diagnosis not present

## 2015-12-05 DIAGNOSIS — Z0000 Encounter for general adult medical examination without abnormal findings: Secondary | ICD-10-CM | POA: Diagnosis not present

## 2015-12-05 DIAGNOSIS — E1121 Type 2 diabetes mellitus with diabetic nephropathy: Secondary | ICD-10-CM | POA: Diagnosis not present

## 2015-12-05 DIAGNOSIS — E1165 Type 2 diabetes mellitus with hyperglycemia: Secondary | ICD-10-CM | POA: Diagnosis not present

## 2015-12-09 DIAGNOSIS — E1165 Type 2 diabetes mellitus with hyperglycemia: Secondary | ICD-10-CM | POA: Diagnosis not present

## 2015-12-09 DIAGNOSIS — E782 Mixed hyperlipidemia: Secondary | ICD-10-CM | POA: Diagnosis not present

## 2015-12-09 DIAGNOSIS — I4891 Unspecified atrial fibrillation: Secondary | ICD-10-CM | POA: Diagnosis not present

## 2015-12-09 DIAGNOSIS — E1121 Type 2 diabetes mellitus with diabetic nephropathy: Secondary | ICD-10-CM | POA: Diagnosis not present

## 2015-12-09 DIAGNOSIS — I10 Essential (primary) hypertension: Secondary | ICD-10-CM | POA: Diagnosis not present

## 2015-12-19 DIAGNOSIS — I4891 Unspecified atrial fibrillation: Secondary | ICD-10-CM | POA: Diagnosis not present

## 2015-12-19 DIAGNOSIS — Z7901 Long term (current) use of anticoagulants: Secondary | ICD-10-CM | POA: Diagnosis not present

## 2016-01-30 DIAGNOSIS — I4891 Unspecified atrial fibrillation: Secondary | ICD-10-CM | POA: Diagnosis not present

## 2016-01-30 DIAGNOSIS — Z7901 Long term (current) use of anticoagulants: Secondary | ICD-10-CM | POA: Diagnosis not present

## 2016-02-27 DIAGNOSIS — I4891 Unspecified atrial fibrillation: Secondary | ICD-10-CM | POA: Diagnosis not present

## 2016-02-27 DIAGNOSIS — Z7901 Long term (current) use of anticoagulants: Secondary | ICD-10-CM | POA: Diagnosis not present

## 2016-04-08 DIAGNOSIS — M545 Low back pain: Secondary | ICD-10-CM | POA: Diagnosis not present

## 2016-04-22 IMAGING — CR DG WRIST COMPLETE 3+V*R*
4 series · 4 of 4 positions shown · non-contrast
Comparison: None.

CLINICAL DATA: WRIST PAIN

EXAM:
RIGHT WRIST - COMPLETE 3+ VIEW

[x wrist lat right]
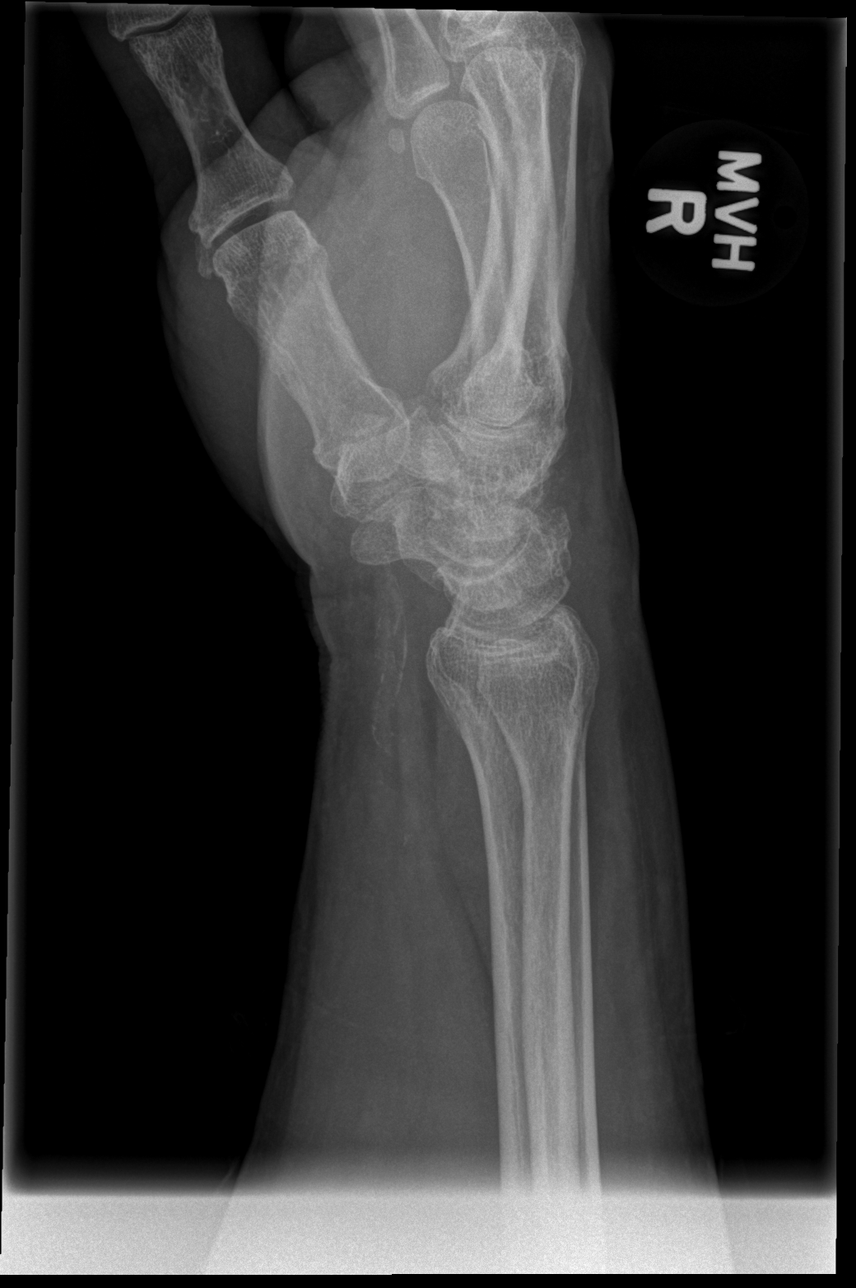

[x wrist obl right]
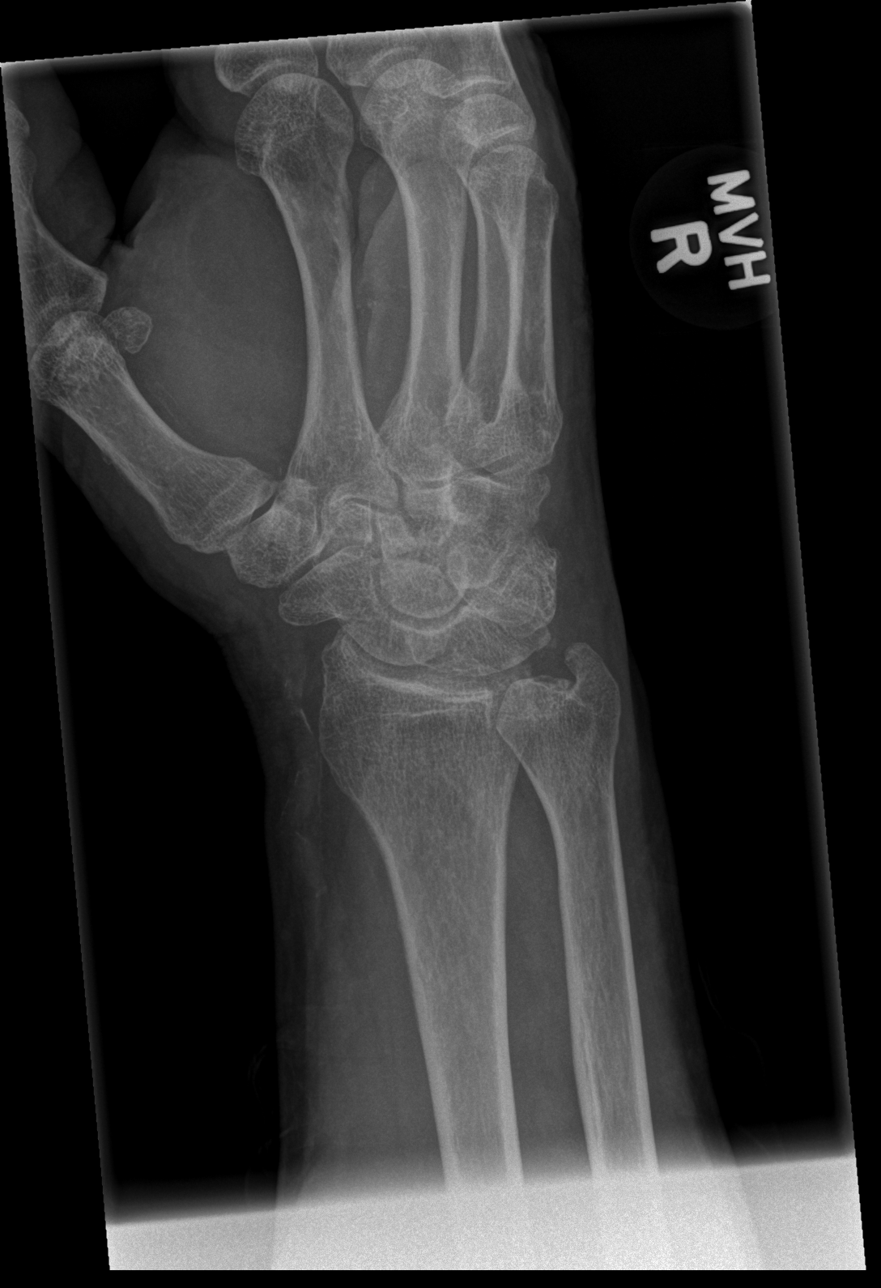

[x wrist pa right]
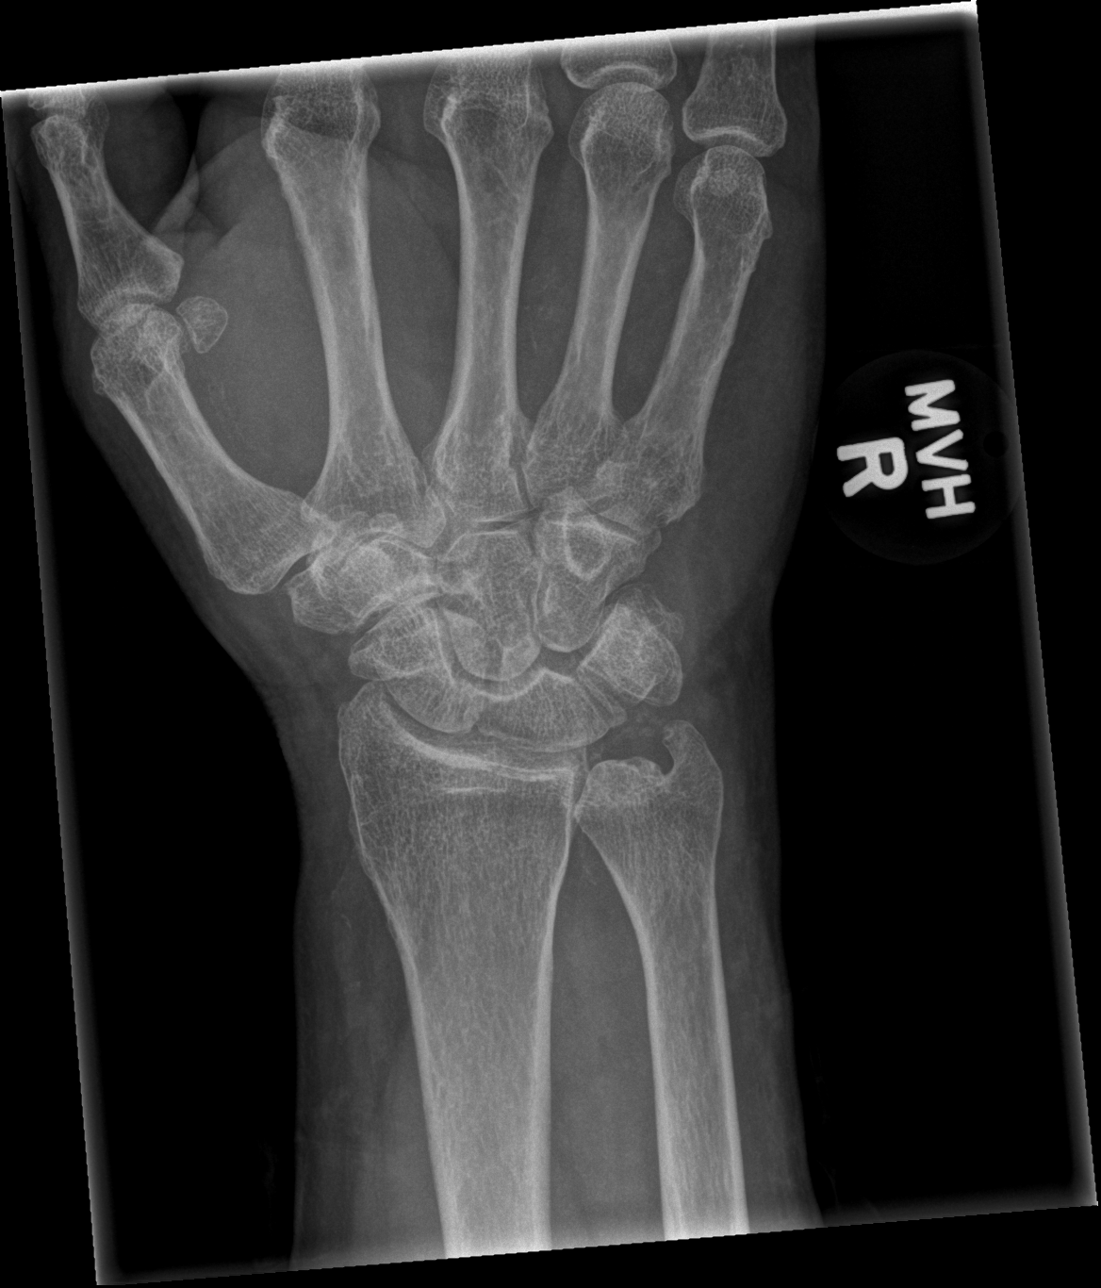

[x wrist navicular view right]
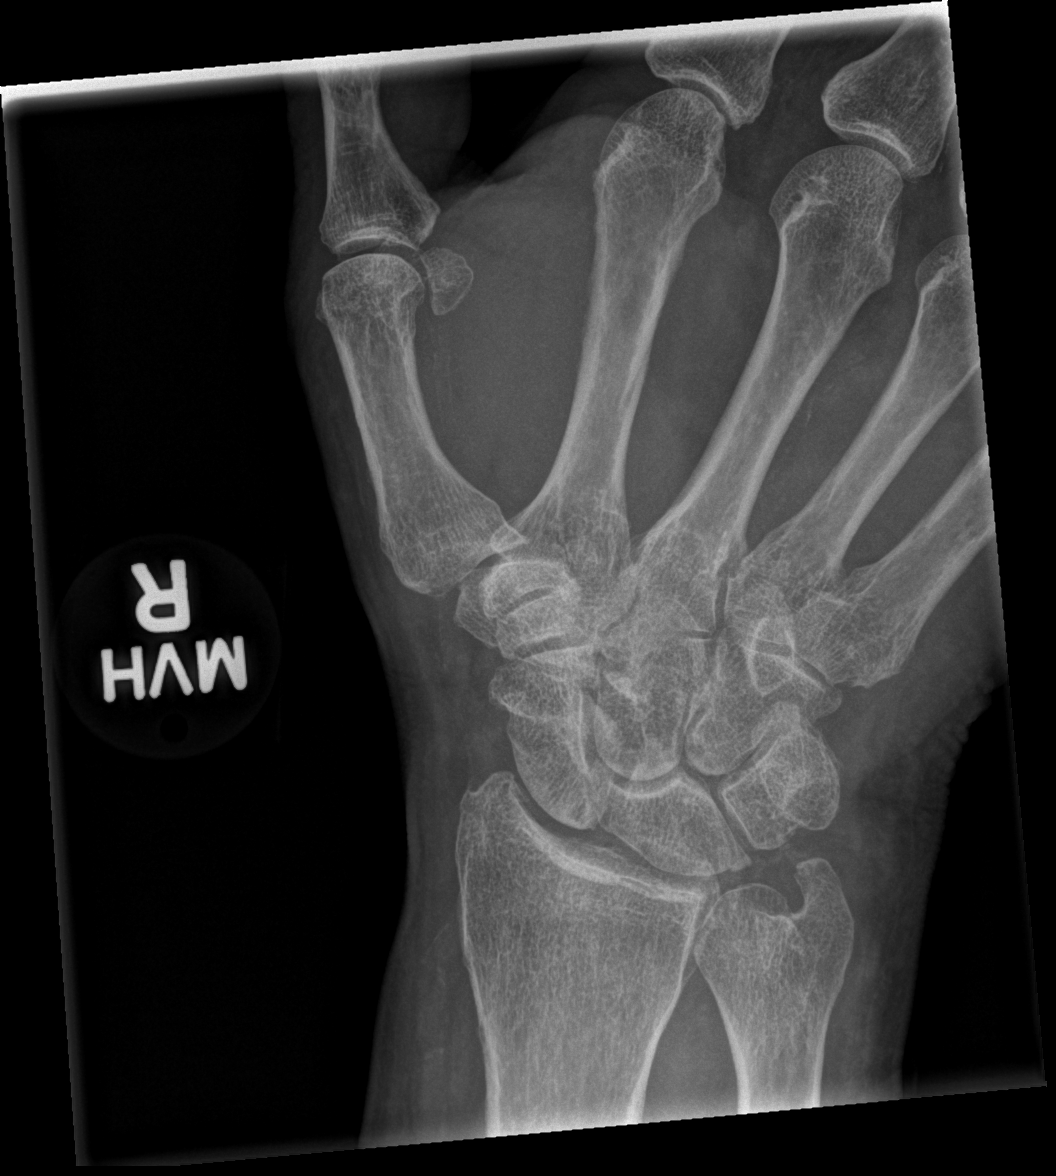

[4 of 4 positions shown; findings below may reference images not displayed]

FINDINGS: Diffuse osteopenia. Carpal rows intact. Chondrocalcinosis in the
TFCC. Negative for fracture or dislocation. No significant osseous
degenerative change. Radial arterial calcifications noted at the
wrist.
IMPRESSION: 1. Negative for fracture or other acute bone abnormality.
2. Chondrocalcinosis in the TFCC suggesting CPPD.

## 2016-04-27 DIAGNOSIS — E782 Mixed hyperlipidemia: Secondary | ICD-10-CM | POA: Diagnosis not present

## 2016-04-27 DIAGNOSIS — I10 Essential (primary) hypertension: Secondary | ICD-10-CM | POA: Diagnosis not present

## 2016-04-27 DIAGNOSIS — E1165 Type 2 diabetes mellitus with hyperglycemia: Secondary | ICD-10-CM | POA: Diagnosis not present

## 2016-04-27 DIAGNOSIS — E1121 Type 2 diabetes mellitus with diabetic nephropathy: Secondary | ICD-10-CM | POA: Diagnosis not present

## 2016-05-04 DIAGNOSIS — M25519 Pain in unspecified shoulder: Secondary | ICD-10-CM | POA: Diagnosis not present

## 2016-05-04 DIAGNOSIS — M545 Low back pain: Secondary | ICD-10-CM | POA: Diagnosis not present

## 2016-05-04 DIAGNOSIS — E1165 Type 2 diabetes mellitus with hyperglycemia: Secondary | ICD-10-CM | POA: Diagnosis not present

## 2016-05-04 DIAGNOSIS — R21 Rash and other nonspecific skin eruption: Secondary | ICD-10-CM | POA: Diagnosis not present

## 2016-05-08 ENCOUNTER — Other Ambulatory Visit: Payer: Self-pay

## 2016-05-29 DIAGNOSIS — R351 Nocturia: Secondary | ICD-10-CM | POA: Diagnosis not present

## 2016-05-29 DIAGNOSIS — R3915 Urgency of urination: Secondary | ICD-10-CM | POA: Diagnosis not present

## 2016-05-29 DIAGNOSIS — N401 Enlarged prostate with lower urinary tract symptoms: Secondary | ICD-10-CM | POA: Diagnosis not present

## 2016-06-29 DIAGNOSIS — R05 Cough: Secondary | ICD-10-CM | POA: Diagnosis not present

## 2016-06-29 DIAGNOSIS — J069 Acute upper respiratory infection, unspecified: Secondary | ICD-10-CM | POA: Diagnosis not present

## 2016-07-21 ENCOUNTER — Inpatient Hospital Stay (HOSPITAL_COMMUNITY)
Admission: EM | Admit: 2016-07-21 | Discharge: 2016-07-25 | DRG: 242 | Disposition: A | Discharge status: Home / Self Care | Payer: Medicare Other | Attending: Cardiology | Admitting: Cardiology

## 2016-07-21 ENCOUNTER — Emergency Department (HOSPITAL_COMMUNITY): Payer: Medicare Other

## 2016-07-21 ENCOUNTER — Encounter (HOSPITAL_COMMUNITY): Payer: Self-pay | Admitting: Emergency Medicine

## 2016-07-21 DIAGNOSIS — Z950 Presence of cardiac pacemaker: Secondary | ICD-10-CM | POA: Diagnosis not present

## 2016-07-21 DIAGNOSIS — Z794 Long term (current) use of insulin: Secondary | ICD-10-CM | POA: Diagnosis not present

## 2016-07-21 DIAGNOSIS — E785 Hyperlipidemia, unspecified: Secondary | ICD-10-CM | POA: Diagnosis not present

## 2016-07-21 DIAGNOSIS — I480 Paroxysmal atrial fibrillation: Secondary | ICD-10-CM

## 2016-07-21 DIAGNOSIS — R001 Bradycardia, unspecified: Secondary | ICD-10-CM

## 2016-07-21 DIAGNOSIS — Z7901 Long term (current) use of anticoagulants: Secondary | ICD-10-CM | POA: Diagnosis not present

## 2016-07-21 DIAGNOSIS — I081 Rheumatic disorders of both mitral and tricuspid valves: Secondary | ICD-10-CM | POA: Diagnosis present

## 2016-07-21 DIAGNOSIS — I071 Rheumatic tricuspid insufficiency: Secondary | ICD-10-CM

## 2016-07-21 DIAGNOSIS — E876 Hypokalemia: Secondary | ICD-10-CM | POA: Diagnosis not present

## 2016-07-21 DIAGNOSIS — N400 Enlarged prostate without lower urinary tract symptoms: Secondary | ICD-10-CM

## 2016-07-21 DIAGNOSIS — I4510 Unspecified right bundle-branch block: Secondary | ICD-10-CM | POA: Diagnosis present

## 2016-07-21 DIAGNOSIS — I2510 Atherosclerotic heart disease of native coronary artery without angina pectoris: Secondary | ICD-10-CM | POA: Diagnosis not present

## 2016-07-21 DIAGNOSIS — E7800 Pure hypercholesterolemia, unspecified: Secondary | ICD-10-CM | POA: Diagnosis present

## 2016-07-21 DIAGNOSIS — I10 Essential (primary) hypertension: Secondary | ICD-10-CM

## 2016-07-21 DIAGNOSIS — I481 Persistent atrial fibrillation: Secondary | ICD-10-CM | POA: Diagnosis present

## 2016-07-21 DIAGNOSIS — Z87891 Personal history of nicotine dependence: Secondary | ICD-10-CM

## 2016-07-21 DIAGNOSIS — I495 Sick sinus syndrome: Principal | ICD-10-CM | POA: Diagnosis present

## 2016-07-21 DIAGNOSIS — N289 Disorder of kidney and ureter, unspecified: Secondary | ICD-10-CM | POA: Diagnosis present

## 2016-07-21 DIAGNOSIS — Z7984 Long term (current) use of oral hypoglycemic drugs: Secondary | ICD-10-CM

## 2016-07-21 DIAGNOSIS — R05 Cough: Secondary | ICD-10-CM | POA: Diagnosis not present

## 2016-07-21 DIAGNOSIS — I5031 Acute diastolic (congestive) heart failure: Secondary | ICD-10-CM | POA: Diagnosis not present

## 2016-07-21 DIAGNOSIS — I340 Nonrheumatic mitral (valve) insufficiency: Secondary | ICD-10-CM

## 2016-07-21 DIAGNOSIS — I509 Heart failure, unspecified: Secondary | ICD-10-CM | POA: Diagnosis not present

## 2016-07-21 DIAGNOSIS — E119 Type 2 diabetes mellitus without complications: Secondary | ICD-10-CM

## 2016-07-21 DIAGNOSIS — Z79899 Other long term (current) drug therapy: Secondary | ICD-10-CM | POA: Diagnosis not present

## 2016-07-21 DIAGNOSIS — I110 Hypertensive heart disease with heart failure: Secondary | ICD-10-CM | POA: Diagnosis present

## 2016-07-21 DIAGNOSIS — I248 Other forms of acute ischemic heart disease: Secondary | ICD-10-CM | POA: Diagnosis present

## 2016-07-21 DIAGNOSIS — I498 Other specified cardiac arrhythmias: Secondary | ICD-10-CM | POA: Diagnosis not present

## 2016-07-21 DIAGNOSIS — I482 Chronic atrial fibrillation: Secondary | ICD-10-CM | POA: Diagnosis not present

## 2016-07-21 DIAGNOSIS — I5033 Acute on chronic diastolic (congestive) heart failure: Secondary | ICD-10-CM | POA: Diagnosis not present

## 2016-07-21 DIAGNOSIS — I2489 Other forms of acute ischemic heart disease: Secondary | ICD-10-CM

## 2016-07-21 DIAGNOSIS — Z7982 Long term (current) use of aspirin: Secondary | ICD-10-CM

## 2016-07-21 DIAGNOSIS — I519 Heart disease, unspecified: Secondary | ICD-10-CM

## 2016-07-21 HISTORY — DX: Other forms of acute ischemic heart disease: I24.89

## 2016-07-21 HISTORY — DX: Nonrheumatic mitral (valve) insufficiency: I34.0

## 2016-07-21 HISTORY — DX: Other forms of acute ischemic heart disease: I24.8

## 2016-07-21 HISTORY — DX: Rheumatic tricuspid insufficiency: I07.1

## 2016-07-21 HISTORY — DX: Benign prostatic hyperplasia without lower urinary tract symptoms: N40.0

## 2016-07-21 HISTORY — DX: Presence of cardiac pacemaker: Z95.0

## 2016-07-21 HISTORY — DX: Paroxysmal atrial fibrillation: I48.0

## 2016-07-21 HISTORY — DX: Disorder of kidney and ureter, unspecified: N28.9

## 2016-07-21 HISTORY — DX: Chronic diastolic (congestive) heart failure: I50.32

## 2016-07-21 HISTORY — DX: Bradycardia, unspecified: R00.1

## 2016-07-21 HISTORY — DX: Heart disease, unspecified: I51.9

## 2016-07-21 LAB — CBC WITH DIFFERENTIAL/PLATELET
Basophils Absolute: 0 10*3/uL (ref 0.0–0.1)
Basophils Relative: 0 %
EOS PCT: 2 %
Eosinophils Absolute: 0.2 10*3/uL (ref 0.0–0.7)
HCT: 34.6 % — ABNORMAL LOW (ref 39.0–52.0)
Hemoglobin: 11.6 g/dL — ABNORMAL LOW (ref 13.0–17.0)
LYMPHS PCT: 16 %
Lymphs Abs: 1.4 10*3/uL (ref 0.7–4.0)
MCH: 28.4 pg (ref 26.0–34.0)
MCHC: 33.5 g/dL (ref 30.0–36.0)
MCV: 84.6 fL (ref 78.0–100.0)
MONO ABS: 0.9 10*3/uL (ref 0.1–1.0)
Monocytes Relative: 10 %
Neutro Abs: 6.2 10*3/uL (ref 1.7–7.7)
Neutrophils Relative %: 72 %
PLATELETS: 155 10*3/uL (ref 150–400)
RBC: 4.09 MIL/uL — ABNORMAL LOW (ref 4.22–5.81)
RDW: 15 % (ref 11.5–15.5)
WBC: 8.6 10*3/uL (ref 4.0–10.5)

## 2016-07-21 LAB — COMPREHENSIVE METABOLIC PANEL
ALBUMIN: 4.2 g/dL (ref 3.5–5.0)
ALT: 17 U/L (ref 17–63)
AST: 25 U/L (ref 15–41)
Alkaline Phosphatase: 67 U/L (ref 38–126)
Anion gap: 8 (ref 5–15)
BUN: 13 mg/dL (ref 6–20)
CO2: 22 mmol/L (ref 22–32)
Calcium: 8.9 mg/dL (ref 8.9–10.3)
Chloride: 109 mmol/L (ref 101–111)
Creatinine, Ser: 1.09 mg/dL (ref 0.61–1.24)
GFR calc Af Amer: >60 mL/min (ref >60)
GFR calc non Af Amer: >60 mL/min (ref >60)
GLUCOSE: 111 mg/dL — AB (ref 65–99)
POTASSIUM: 3.6 mmol/L (ref 3.5–5.1)
Sodium: 139 mmol/L (ref 135–145)
Total Bilirubin: 1.5 mg/dL — ABNORMAL HIGH (ref 0.3–1.2)
Total Protein: 7.2 g/dL (ref 6.5–8.1)

## 2016-07-21 LAB — BRAIN NATRIURETIC PEPTIDE: B Natriuretic Peptide: 478 pg/mL — ABNORMAL HIGH (ref 0.0–100.0)

## 2016-07-21 LAB — I-STAT TROPONIN, ED: TROPONIN I, POC: 1.17 ng/mL — AB (ref 0.00–0.08)

## 2016-07-21 LAB — TROPONIN I: TROPONIN I: 3.05 ng/mL — AB (ref <0.03)

## 2016-07-21 MED ORDER — ASPIRIN 81 MG PO CHEW
324.0000 mg | CHEWABLE_TABLET | Freq: Once | ORAL | Status: DC
  Filled 2016-07-21: qty 4

## 2016-07-21 NOTE — ED Notes (Signed)
Writer notified EDP of abnormal I-stat trop 

## 2016-07-21 NOTE — ED Triage Notes (Signed)
Patient states that for about 3 weeks now been having dry cough, bilat lower leg swelling, and approx 30 lb weight gain.  Patient was told by PCP office for further testing.  Patient states that he has not been diagnosed with CHF but believes that is why his MD sent him over here.

## 2016-07-21 NOTE — ED Notes (Signed)
[facility] was notified of pt's transfer to [facility].

## 2016-07-21 NOTE — ED Notes (Signed)
[facility] here to transfer pt to [facility] Rm [id number].

## 2016-07-21 NOTE — ED Provider Notes (Signed)
[facility] DEPT Provider Note   CSN: [id number] Arrival date & time: 07/21/16  1652     History   Chief Complaint Chief Complaint  Patient presents with  . Leg Swelling  . weight gain    HPI Nicholas Harris is a 75 y.o. male.  HPI   Patient is a 75 year old male with history of A. fib diabetes hypertension and hypercholesterol presenting with increasing shortness of breath and cough for the last 4-5 weeks. Patient's been treated as an outpatient for bronchitis, pneumonia. Patient completed course of azithromycin.  Patient has been unable to lie flat. He has noticed swelling in bilateral lower extremities.  Past Medical History:  Diagnosis Date  . A-fib ([place])   . Diabetes mellitus without complication ([place])   . High cholesterol   . Hypertension     There are no active problems to display for this patient.   History reviewed. No pertinent surgical history.     Home Medications    Prior to Admission medications   Medication Sig Start Date End Date Taking? Authorizing Provider  amLODipine (NORVASC) 5 MG tablet Take 5 mg by mouth daily.    Historical Provider, MD  atorvastatin (LIPITOR) 20 MG tablet Take 20 mg by mouth at bedtime. 01/29/15   Historical Provider, MD  cephALEXin (KEFLEX) 500 MG capsule Take 1 capsule (500 mg total) by mouth 4 (four) times daily. 02/21/15   Tatyana Kirichenko, PA-C  finasteride (PROSCAR) 5 MG tablet Take 5 mg by mouth daily.    Historical Provider, MD  glimepiride (AMARYL) 4 MG tablet Take 4 mg by mouth daily with breakfast.    Historical Provider, MD  HYDROcodone-acetaminophen (NORCO/VICODIN) 5-325 MG per tablet Take 1 tablet by mouth every 4 (four) hours as needed. 02/21/15   Tatyana Kirichenko, PA-C  lisinopril (PRINIVIL,ZESTRIL) 20 MG tablet Take 20 mg by mouth daily.    Historical Provider, MD  metFORMIN (GLUCOPHAGE-XR) 500 MG 24 hr tablet Take 1,000 mg by mouth 2 (two) times daily. 01/22/15   Historical Provider, MD  tamsulosin  (FLOMAX) 0.4 MG CAPS capsule Take 0.4 mg by mouth daily.    Historical Provider, MD  warfarin (COUMADIN) 5 MG tablet Take 5 mg by mouth daily.    Historical Provider, MD    Family History No family history on file.  Social History Social History  Substance Use Topics  . Smoking status: Former Research scientist (life sciences)  . Smokeless tobacco: Never Used  . Alcohol use No     Comment: 2-3/wk     Allergies   Patient has no known allergies.   Review of Systems Review of Systems  Constitutional: Positive for fatigue. Negative for activity change and fever.  Respiratory: Positive for cough and shortness of breath.   Cardiovascular: Positive for leg swelling. Negative for chest pain.  Gastrointestinal: Negative for abdominal pain.  All other systems reviewed and are negative.    Physical Exam Updated Vital Signs BP 181/74 (BP Location: Right Arm)   Pulse (!) 45   Temp 97.8 F (36.6 C) (Oral)   Resp 18   Ht 5\' 11"  (1.803 m)   Wt 288 lb (130.6 kg)   SpO2 98%   BMI 40.17 kg/m   Physical Exam  Constitutional: He is oriented to person, place, and time. He appears well-nourished.  HENT:  Head: Normocephalic.  Mouth/Throat: Oropharynx is clear and moist.  Eyes: Conjunctivae are normal.  Neck: No tracheal deviation present.  Cardiovascular: Regular rhythm and normal heart sounds.   No murmur  heard. bradycardia  Pulmonary/Chest: Effort normal. No stridor. No respiratory distress.  Crackles.  Abdominal: Soft. There is no tenderness. There is no guarding.  Musculoskeletal: Normal range of motion. He exhibits edema.  +3 bilateral edema lower extremities.  Neurological: He is oriented to person, place, and time. No cranial nerve deficit.  Skin: Skin is warm and dry. No rash noted. He is not diaphoretic.  Psychiatric: He has a normal mood and affect. His behavior is normal.  Nursing note and vitals reviewed.    ED Treatments / Results  Labs (all labs ordered are listed, but only abnormal  results are displayed) Labs Reviewed  I-STAT TROPOININ, ED - Abnormal; Notable for the following:       Result Value   Troponin i, poc 1.17 (*)    All other components within normal limits  COMPREHENSIVE METABOLIC PANEL  CBC WITH DIFFERENTIAL/PLATELET  BRAIN NATRIURETIC PEPTIDE  TROPONIN I    EKG  EKG Interpretation  Date/Time:  Tuesday July 21 2016 20:10:57 EST Ventricular Rate:  45 PR Interval:    QRS Duration: 156 QT Interval:  502 QTC Calculation: 435 R Axis:   -54 Text Interpretation:  Junctional rhythm IVCD, consider atypical RBBB Anteroseptal infarct, age indeterminate new brady junctional rhythm.  Confirmed by Gerald Leitz ([id number]) on 07/21/2016 8:59:17 PM       Radiology Dg Chest 2 View  Result Date: 07/21/2016 CLINICAL DATA:  Subacute onset of dry cough and bilateral lower extremity swelling. 30 pounds of weight gain. Initial encounter. EXAM: CHEST  2 VIEW COMPARISON:  Chest radiograph performed 06/29/2016, and CT of the chest performed 06/24/2015 FINDINGS: The lungs are well-aerated. Mild vascular congestion is noted. There is no evidence of focal opacification, pleural effusion or pneumothorax. The heart is mildly enlarged. No acute osseous abnormalities are seen. IMPRESSION: Mild vascular congestion and cardiomegaly. Lungs remain grossly clear. Electronically Signed   By: Garald Balding M.D.   On: 07/21/2016 21:17    Procedures Procedures (including critical care time)  Medications Ordered in ED Medications - No data to display   Initial Impression / Assessment and Plan / ED Course  I have reviewed the triage vital signs and the nursing notes.  Pertinent labs & imaging results that were available during my care of the patient were reviewed by me and considered in my medical decision making (see chart for details).  Clinical Course     Patient is a 75 year old male presenting with increasing shortness of breath, crackles, bilateral pitting edema  and bradycardia. Patient had increasing symptoms the last 3-4 weeks. Here patient has junctional bradycardic rhythm. Mildly tachypnea on exam.  Troponin positive. Given the length of symptoms (several weeks) concerned that the troponin is from strain rather than true ischemia.  Will get a repeat.  Patient has zero chest pain.  Will need echo and further risk stratification.   [facility] consult cardiology.  Discussed with Dr. Aundra Dubin. Will admit to [facility].   Final Clinical Impressions(s) / ED Diagnoses   Final diagnoses:  None    New Prescriptions New Prescriptions   No medications on file     Milica Gully Julio Alm, MD 07/22/16 0111

## 2016-07-22 ENCOUNTER — Encounter (HOSPITAL_COMMUNITY): Payer: Self-pay

## 2016-07-22 DIAGNOSIS — I509 Heart failure, unspecified: Secondary | ICD-10-CM

## 2016-07-22 DIAGNOSIS — I482 Chronic atrial fibrillation: Secondary | ICD-10-CM

## 2016-07-22 DIAGNOSIS — I5031 Acute diastolic (congestive) heart failure: Secondary | ICD-10-CM | POA: Insufficient documentation

## 2016-07-22 DIAGNOSIS — R001 Bradycardia, unspecified: Secondary | ICD-10-CM

## 2016-07-22 DIAGNOSIS — I498 Other specified cardiac arrhythmias: Secondary | ICD-10-CM

## 2016-07-22 LAB — GLUCOSE, CAPILLARY
GLUCOSE-CAPILLARY: 129 mg/dL — AB (ref 65–99)
GLUCOSE-CAPILLARY: 100 mg/dL — AB (ref 65–99)
GLUCOSE-CAPILLARY: 103 mg/dL — AB (ref 65–99)
Glucose-Capillary: 129 mg/dL — ABNORMAL HIGH (ref 65–99)
Glucose-Capillary: 88 mg/dL (ref 65–99)

## 2016-07-22 LAB — TROPONIN I
TROPONIN I: 2.64 ng/mL — AB (ref <0.03)
TROPONIN I: 2.45 ng/mL — AB (ref <0.03)
Troponin I: 2.72 ng/mL (ref <0.03)

## 2016-07-22 LAB — BASIC METABOLIC PANEL
Anion gap: 10 (ref 5–15)
Anion gap: 11 (ref 5–15)
BUN: 10 mg/dL (ref 6–20)
BUN: 11 mg/dL (ref 6–20)
CHLORIDE: 105 mmol/L (ref 101–111)
CO2: 23 mmol/L (ref 22–32)
CO2: 22 mmol/L (ref 22–32)
CREATININE: 1.14 mg/dL (ref 0.61–1.24)
CREATININE: 1.25 mg/dL — AB (ref 0.61–1.24)
Calcium: 9.1 mg/dL (ref 8.9–10.3)
Calcium: 9.2 mg/dL (ref 8.9–10.3)
Chloride: 108 mmol/L (ref 101–111)
GFR calc Af Amer: >60 mL/min (ref >60)
GFR calc Af Amer: >60 mL/min (ref >60)
GFR calc non Af Amer: 54 mL/min — ABNORMAL LOW (ref >60)
Glucose, Bld: 99 mg/dL (ref 65–99)
Glucose, Bld: 188 mg/dL — ABNORMAL HIGH (ref 65–99)
Potassium: 3.8 mmol/L (ref 3.5–5.1)
Potassium: 3.9 mmol/L (ref 3.5–5.1)
SODIUM: 141 mmol/L (ref 135–145)
SODIUM: 138 mmol/L (ref 135–145)

## 2016-07-22 LAB — PROTIME-INR
INR: 2.78
INR: 2.34
Prothrombin Time: 30 seconds — ABNORMAL HIGH (ref 11.4–15.2)
Prothrombin Time: 26.1 seconds — ABNORMAL HIGH (ref 11.4–15.2)

## 2016-07-22 LAB — TSH: TSH: 3.343 u[IU]/mL (ref 0.350–4.500)

## 2016-07-22 MED ORDER — GUAIFENESIN ER 600 MG PO TB12
600.0000 mg | ORAL_TABLET | Freq: Two times a day (BID) | ORAL | Status: DC
  Administered 2016-07-22 – 2016-07-25 (×7): 600 mg via ORAL
  Filled 2016-07-22 (×7): qty 1

## 2016-07-22 MED ORDER — POTASSIUM CHLORIDE CRYS ER 20 MEQ PO TBCR
40.0000 meq | EXTENDED_RELEASE_TABLET | Freq: Once | ORAL | Status: AC
  Administered 2016-07-22: 40 meq via ORAL
  Filled 2016-07-22: qty 2

## 2016-07-22 MED ORDER — LISINOPRIL 20 MG PO TABS
20.0000 mg | ORAL_TABLET | Freq: Every day | ORAL | Status: DC
  Administered 2016-07-22 – 2016-07-25 (×4): 20 mg via ORAL
  Filled 2016-07-22 (×4): qty 1

## 2016-07-22 MED ORDER — ACETAMINOPHEN 325 MG PO TABS: 650.0000 mg | ORAL_TABLET | ORAL | Status: DC | PRN

## 2016-07-22 MED ORDER — PHYTONADIONE 1 MG/0.5 ML ORAL SOLUTION
2.0000 mg | Freq: Once | ORAL | Status: AC
  Administered 2016-07-22: 2 mg via ORAL
  Filled 2016-07-22 (×2): qty 1

## 2016-07-22 MED ORDER — FUROSEMIDE 10 MG/ML IJ SOLN
INTRAMUSCULAR | Status: AC
  Filled 2016-07-22: qty 8

## 2016-07-22 MED ORDER — ATORVASTATIN CALCIUM 20 MG PO TABS
20.0000 mg | ORAL_TABLET | Freq: Every day | ORAL | Status: DC
  Administered 2016-07-22 – 2016-07-24 (×3): 20 mg via ORAL
  Filled 2016-07-22 (×3): qty 1

## 2016-07-22 MED ORDER — ASPIRIN EC 81 MG PO TBEC
81.0000 mg | DELAYED_RELEASE_TABLET | Freq: Every day | ORAL | Status: DC
  Administered 2016-07-22 – 2016-07-24 (×3): 81 mg via ORAL
  Filled 2016-07-22 (×3): qty 1

## 2016-07-22 MED ORDER — SODIUM CHLORIDE 0.9 % IV SOLN: 250.0000 mL | INTRAVENOUS | Status: DC | PRN

## 2016-07-22 MED ORDER — FINASTERIDE 5 MG PO TABS
5.0000 mg | ORAL_TABLET | Freq: Every day | ORAL | Status: DC
  Administered 2016-07-22 – 2016-07-25 (×4): 5 mg via ORAL
  Filled 2016-07-22 (×5): qty 1

## 2016-07-22 MED ORDER — SODIUM CHLORIDE 0.9% FLUSH
3.0000 mL | Freq: Two times a day (BID) | INTRAVENOUS | Status: DC
  Administered 2016-07-22 – 2016-07-25 (×8): 3 mL via INTRAVENOUS

## 2016-07-22 MED ORDER — TAMSULOSIN HCL 0.4 MG PO CAPS
0.8000 mg | ORAL_CAPSULE | Freq: Every day | ORAL | Status: DC
  Administered 2016-07-22 – 2016-07-25 (×4): 0.8 mg via ORAL
  Filled 2016-07-22 (×4): qty 2

## 2016-07-22 MED ORDER — ONDANSETRON HCL 4 MG/2ML IJ SOLN: 4.0000 mg | Freq: Four times a day (QID) | INTRAMUSCULAR | Status: DC | PRN

## 2016-07-22 MED ORDER — INSULIN ASPART 100 UNIT/ML ~~LOC~~ SOLN: 0.0000 [IU] | Freq: Every day | SUBCUTANEOUS | Status: DC

## 2016-07-22 MED ORDER — SODIUM CHLORIDE 0.9% FLUSH: 3.0000 mL | INTRAVENOUS | Status: DC | PRN

## 2016-07-22 MED ORDER — LIDOCAINE 5 % EX PTCH
1.0000 | MEDICATED_PATCH | Freq: Once | CUTANEOUS | Status: AC
  Administered 2016-07-22: 1 via TRANSDERMAL
  Filled 2016-07-22: qty 1

## 2016-07-22 MED ORDER — INSULIN ASPART 100 UNIT/ML ~~LOC~~ SOLN
0.0000 [IU] | Freq: Three times a day (TID) | SUBCUTANEOUS | Status: DC
  Administered 2016-07-22 – 2016-07-25 (×3): 2 [IU] via SUBCUTANEOUS
  Administered 2016-07-25: 3 [IU] via SUBCUTANEOUS

## 2016-07-22 MED ORDER — FUROSEMIDE 10 MG/ML IJ SOLN
80.0000 mg | Freq: Two times a day (BID) | INTRAMUSCULAR | Status: DC
  Administered 2016-07-22 – 2016-07-25 (×8): 80 mg via INTRAVENOUS
  Filled 2016-07-22 (×7): qty 8

## 2016-07-22 NOTE — Progress Notes (Signed)
Patient admitted, no complaints of pain or shortness of breath, patient settled in while family visited.

## 2016-07-22 NOTE — H&P (Signed)
Physician History and Physical    THOMPSON GATO MRN: [id number] DOB/AGE: 13-Mar-1941 75 y.o. Admit date: 07/21/2016  PCP: Ashby Dawes Primary Cardiologist: Luanna Cole  HPI: 75 yo with history of HTN, DM2, hyperlipidemia, and atrial fibrillation (presumed chronic) presented with bradycardia and acute CHF.  Patient has known of his atrial fibrillation diagnosis x years.  Not sure if persistent or paroxysmal.  He is on warfarin at home.  Diagnosis of afib was made at [facility] in [place] several years ago and is now followed by his PCP.  No other known cardiac disease.   For about 3 weeks, he has been coughing. He has developed dyspnea walking 20 feet.  He has gained 30 lbs and has noted lower leg swelling.  He saw PCP and has been treated for bronchitis with antibiotics and cough syrup.  This did not help.  He has been sleeping in a recliner as he coughs a lot when lying supine.  He has had only 1 episode of dizziness.  That occurred about 3 weeks ago while standing and passed after a few seconds.  No loss of consciousness.  No chest pain except when he coughs hard.    Review of systems complete and found to be negative unless listed above   PMH: 1. HTN 2. Hyperlipidemia 3. Type II diabetes 4. BPH 5. Atrial fibrillation: x years, diagnosed at [facility] in [place].   FH:  No known heart disease.   Social History   Social History  . Marital status: Divorced    Spouse name: N/A  . Number of children: N/A  . Years of education: N/A   Occupational History  . Not on file.   Social History Main Topics  . Smoking status: Former Research scientist (life sciences)  . Smokeless tobacco: Never Used  . Alcohol use No     Comment: 2-3/wk  . Drug use: Unknown  . Sexual activity: Not on file   Other Topics Concern  . Not on file   Social History Narrative  . 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 amLODipine (NORVASC) 5  MG tablet Take 5 mg by mouth daily with breakfast.    07/21/2016 at Unknown time  . Ascorbic Acid (VITAMIN C) 1000 MG tablet Take 1,000 mg by mouth daily.   07/21/2016 at Unknown time  . atorvastatin (LIPITOR) 20 MG tablet Take 20 mg by mouth at bedtime.  0 07/21/2016 at Unknown time  . finasteride (PROSCAR) 5 MG tablet Take 5 mg by mouth daily with breakfast.    07/21/2016 at Unknown time  . glimepiride (AMARYL) 4 MG tablet Take 4 mg by mouth daily with breakfast.   07/21/2016 at Unknown time  . guaiFENesin (MUCINEX) 600 MG 12 hr tablet Take 600 mg by mouth 2 (two) times daily.   07/21/2016 at Unknown time  . lidocaine (LIDODERM) 5 % Place 1 patch onto the skin once. Remove & Discard patch within 12 hours or as directed by MD   07/19/2016  . lisinopril (PRINIVIL,ZESTRIL) 20 MG tablet Take 20 mg by mouth daily with breakfast.    07/21/2016 at 0600  . metFORMIN (GLUCOPHAGE-XR) 500 MG 24 hr tablet Take 1,000 mg by mouth 2 (two) times daily.  0 07/21/2016 at am  . tamsulosin (FLOMAX) 0.4 MG CAPS capsule Take 0.8 mg by mouth daily after breakfast.    07/21/2016 at Unknown time  . warfarin (COUMADIN) 5 MG  tablet Take 2.5-5 mg by mouth daily. Takes 5mg  everyday except Sunday takes 2.5mg    07/21/2016 at 0600    Physical Exam: Blood pressure (!) 167/83, pulse (!) 53, temperature 97.5 F (36.4 C), temperature source Oral, resp. rate 16, height 5\' 11"  (1.803 m), weight 275 lb 12.8 oz (125.1 kg), SpO2 95 %.  General: NAD Neck: Thick, JVP 14-16 cm, no thyromegaly or thyroid nodule.  Lungs: Decreased breath sounds at bases CV: Nondisplaced PMI.  Heart regular, bradycardic S1/S2, no S3/S4, 2/6 HSM LLSB/apex.  1+ edema to knees bilaterally.  No carotid bruit.  Normal pedal pulses.  Abdomen: Soft, nontender, no hepatosplenomegaly, mild distention.  Skin: Intact without lesions or rashes.  Neurologic: Alert and oriented x 3.  Psych: Normal affect. Extremities: No clubbing or cyanosis.  HEENT: Normal.    Labs:   Lab Results  Component Value Date   WBC 8.6 07/21/2016   HGB 11.6 (L) 07/21/2016   HCT 34.6 (L) 07/21/2016   MCV 84.6 07/21/2016   PLT 155 07/21/2016    Recent Labs Lab 07/21/16 2109  NA 139  K 3.6  CL 109  CO2 22  BUN 13  CREATININE 1.09  CALCIUM 8.9  PROT 7.2  BILITOT 1.5*  ALKPHOS 67  ALT 17  AST 25  GLUCOSE 111*   Lab Results  Component Value Date   TROPONINI 3.05 (HH) 07/21/2016  BNP 478  Radiology: - CXR: vascular congestion  EKG: junctional brady at 42 with RBBB   ASSESSMENT AND PLAN: 75 yo with history of HTN, DM2, hyperlipidemia, and atrial fibrillation (presumed chronic) presented with bradycardia and acute CHF. 1. Acute CHF: No prior CHF diagnosis. Patient has had exertional dyspnea + cough + 30 lb weight gain.  On exam, he is volume overloaded.  NYHA class III symptoms.  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  I doubt that mild bradycardia (HR 45-50 range) caused his CHF.  He also is noted to have TnI increased to 3.05 but with no chest pain.  Cannot rule out ischemic cardiomyopathy though TnI could also be elevated due to decompensated CHF (though TnI is rather high from demand ischemia alone).  - Echocardiogram.  - Lasix 80 mg IV bid, 1st dose now.  - Hold any nodal blockade with bradycardia.  - Can continue lisinopril for now.  2. Elevated troponin: TnI up to 3.05.  ?True NSTEMI with plaque rupture versus demand ischemia with CHF/volume overload.  TnI somewhat high to be demand ischemia.  No chest pain.  No prior history of CAD though he has RFs (DM, HTN, hyperlipidemia).  He will need cardiac cath, but timing will depend on INR.  - Send INR stat.  If INR < 2, would plan for RHC/LHC later this morning.  If not, and no chest pain, may defer until tomorrow and hold warfarin. We discussed risks/benefits of cath and he agrees to proceed.  - Heparin gtt when INR < 2 until  cath done.  - ASA 81, continue statin.  3. Atrial fibrillation: Paroxysmal versus persistent.  Has been known for years, on warfarin at home.  No nodal blockade among home meds.  CHADSVASC = 5. Currently, he is in a junctional bradycardia with rate around 45.  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  He may need a PPM, I will have EP see him while he's here. - Hold warfarin as above for cath, heparin when INR < 2.  - No nodal blockers.  4. 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  Suspect he may end up needing a PPM though currently, BP stable and no immediate indication for pacing.  5. Diabetes: Cover with sliding scale for now.   Signed: Loralie Champagne 07/22/2016, 12:41 AM

## 2016-07-22 NOTE — Consult Note (Signed)
ELECTROPHYSIOLOGY CONSULT NOTE    Patient ID: [name] MRN: [id number], DOB/AGE: [birth year] 75 y.o.  Admit date: 07/21/2016 Date of Consult: 07/22/2016  Primary Physician: Merrilee Seashore, MD Primary Cardiologist: new - Omar Person MD: Aundra Dubin  Reason for Consultation: junctional bradycardia   HPI:  Nicholas Harris is a 75 y.o. male with a past medical history of presumed persistent/permanent atrial fibrillation, diabetes, and hypertension. He reports being diagnosed with atrial fibrillation >15 years ago by the [facility]. He underwent cardiac catheterization at that time which he remembers as being normal. Since then, he has not followed with cardiology but has been maintained on Warfarin for stroke prevention and done well without significant symptoms of atrial fibrillation.  Over the last couple of weeks, he has had increased orthostatic intolerance, shortness of breath, and fatigue. He presented to the ER for evaluation and was found to be in junctional rhythm in the 40's with RBBB escape.  Troponin was elevated and he was admitted for further evaluation. Plan is for echo and right/left heart catheterization when INR allows.    He is being diuresed and is currently some improved. He has not had recent chest pain, frank syncope, palpitations, fevers, chills, nausea or vomiting.  Past Medical History:  Diagnosis Date  . A-fib ([place])   . Diabetes mellitus without complication ([place])   . High cholesterol   . Hypertension      Surgical History: History reviewed. No pertinent surgical history.   Prescriptions Prior to Admission  Medication Sig Dispense Refill Last Dose  . amLODipine (NORVASC) 5 MG tablet Take 5 mg by mouth daily with breakfast.    07/21/2016 at Unknown time  . Ascorbic Acid (VITAMIN C) 1000 MG tablet Take 1,000 mg by mouth daily.   07/21/2016 at Unknown time  . atorvastatin (LIPITOR) 20 MG tablet Take 20 mg by mouth at bedtime.  0 07/21/2016 at  Unknown time  . finasteride (PROSCAR) 5 MG tablet Take 5 mg by mouth daily with breakfast.    07/21/2016 at Unknown time  . glimepiride (AMARYL) 4 MG tablet Take 4 mg by mouth daily with breakfast.   07/21/2016 at Unknown time  . guaiFENesin (MUCINEX) 600 MG 12 hr tablet Take 600 mg by mouth 2 (two) times daily.   07/21/2016 at Unknown time  . lidocaine (LIDODERM) 5 % Place 1 patch onto the skin once. Remove & Discard patch within 12 hours or as directed by MD   07/19/2016  . lisinopril (PRINIVIL,ZESTRIL) 20 MG tablet Take 20 mg by mouth daily with breakfast.    07/21/2016 at 0600  . metFORMIN (GLUCOPHAGE-XR) 500 MG 24 hr tablet Take 1,000 mg by mouth 2 (two) times daily.  0 07/21/2016 at am  . tamsulosin (FLOMAX) 0.4 MG CAPS capsule Take 0.8 mg by mouth daily after breakfast.    07/21/2016 at Unknown time  . warfarin (COUMADIN) 5 MG tablet Take 2.5-5 mg by mouth daily. Takes 5mg  everyday except Sunday takes 2.5mg    07/21/2016 at 0600    Inpatient Medications:  . aspirin  324 mg Oral Once  . aspirin EC  81 mg Oral Daily  . atorvastatin  20 mg Oral QHS  . finasteride  5 mg Oral Q breakfast  . furosemide  80 mg Intravenous BID  . guaiFENesin  600 mg Oral BID  . insulin aspart  0-15 Units Subcutaneous TID WC  . insulin aspart  0-5 Units Subcutaneous QHS  . lisinopril  20 mg Oral Q breakfast  .  sodium chloride flush  3 mL Intravenous Q12H  . tamsulosin  0.8 mg Oral QPC breakfast    Allergies: No Known Allergies  Social History   Social History  . Marital status: Divorced    Spouse name: N/A  . Number of children: N/A  . Years of education: N/A   Occupational History  . Not on file.   Social History Main Topics  . Smoking status: Former Research scientist (life sciences)  . Smokeless tobacco: Never Used  . Alcohol use No     Comment: 2-3/wk  . Drug use: Unknown  . Sexual activity: Not on file   Other Topics Concern  . Not on file   Social History Narrative  . No narrative on file     No family  history on file.   Review of Systems: General: No chills, fever, night sweats or weight changes  Cardiovascular:  No chest pain, dyspnea on exertion, edema, orthopnea, palpitations, paroxysmal nocturnal dyspnea Dermatological: No rash, lesions or masses Respiratory: No cough, dyspnea Urologic: No hematuria, dysuria Abdominal: No nausea, vomiting, diarrhea, bright red blood per rectum, melena, or hematemesis Neurologic: No visual changes, weakness, changes in mental status All other systems reviewed and are otherwise negative except as noted above.  Physical Exam: Vitals:   07/22/16 0013 07/22/16 0545 07/22/16 0858 07/22/16 1221  BP: (!) 167/83 116/60 (!) 141/64 99/79  Pulse: (!) 53 (!) 40 (!) 48 (!) 42  Resp: 16 16  20   Temp: 97.5 F (36.4 C) 97.8 F (36.6 C)  97.5 F (36.4 C)  TempSrc: Oral Oral  Oral  SpO2: 95% 96%  100%  Weight: 275 lb 12.8 oz (125.1 kg)     Height: 5\' 11"  (1.803 m)       GEN- The patient is well appearing, alert and oriented x 3 today.   HEENT: normocephalic, atraumatic; sclera clear, conjunctiva pink; hearing intact; oropharynx clear; neck supple  Lungs- Clear to ausculation bilaterally, normal work of breathing.  No wheezes, rales, rhonchi Heart- Bradycardic regular rate and rhythm  GI- soft, non-tender, non-distended, bowel sounds present  Extremities- no clubbing, cyanosis, or edema  MS- no significant deformity or atrophy Skin- warm and dry, no rash or lesion Psych- euthymic mood, full affect Neuro- strength and sensation are intact  Labs:   Lab Results  Component Value Date   WBC 8.6 07/21/2016   HGB 11.6 (L) 07/21/2016   HCT 34.6 (L) 07/21/2016   MCV 84.6 07/21/2016   PLT 155 07/21/2016    Recent Labs Lab 07/21/16 2109  07/22/16 1228  NA 139  < > 138  K 3.6  < > 3.9  CL 109  < > 105  CO2 22  < > 22  BUN 13  < > 11  CREATININE 1.09  < > 1.25*  CALCIUM 8.9  < > 9.2  PROT 7.2  --   --   BILITOT 1.5*  --   --   ALKPHOS 67  --   --    ALT 17  --   --   AST 25  --   --   GLUCOSE 111*  < > 188*  < > = values in this interval not displayed.  TSH 3.43   Radiology/Studies: Dg Chest 2 View Result Date: 07/21/2016 CLINICAL DATA:  Subacute onset of dry cough and bilateral lower extremity swelling. 30 pounds of weight gain. Initial encounter. EXAM: CHEST  2 VIEW COMPARISON:  Chest radiograph performed 06/29/2016, and CT of the chest performed 06/24/2015  FINDINGS: The lungs are well-aerated. Mild vascular congestion is noted. There is no evidence of focal opacification, pleural effusion or pneumothorax. The heart is mildly enlarged. No acute osseous abnormalities are seen. IMPRESSION: Mild vascular congestion and cardiomegaly. Lungs remain grossly clear. Electronically Signed   By: Garald Balding M.D.   On: 07/21/2016 21:17  Chest x-ray also reviewed personally  [id number] rhythm, rate 45, RBBB   TELEMETRY: junctional rhythm 40's Personal reviewed. There is some variability in [place] conduction    Assessment/Plan: 1.  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 He will eventually require pacing, but would obtain echo and complete ischemic evaluation first.  We will follow along  2.  Persistent atrial fibrillation Diagnosed 15 years ago and patient has been maintained on Warfarin but no AVN blocking agents Continue [place] long term for CHADS2VASC of 4  3.  Elevated troponin Plan for ischemic eval when INR acceptable  4.  Acute heart failure Management per AHF team  Dr Caryl Comes to see later today   Signed, Chanetta Marshall, NP 07/22/2016 2:35 PM  Agree with details as outlined above.  The patient has the acute onset of symptoms of heart failure over the last 3 weeks, characterized by edema and orthopnea. His troponins were elevated potentially representing a out-of-hospital MI which might have been the trigger of his decompensation.  The upward trend in his hospital  troponin is not all that consistent with his hypothesis   Another explanation might be his rhythm. His ECG suggests junctional rhythm with retrograde conduction. It is possible that he has had interval sinus node dysfunction giving rise to the bradycardia. While I agree in general with Dr. DM that her heart rate of 40 should not cause heart failure, a junctional rhythm particularly in the setting of hypertensive heart disease and diastolic dysfunction probably also ascribable to sleep apnea might well explain the acute decompensation  I have suggested to Dr. DM at the time of his catheterization that he could use a  temporary pacing wire to try to assess the presence of atrial electrical activity confirming retrograde conduction and then to see if atrial pacing would allow ventricular capture.   In the interval, I will try and get ECGs from his PCP

## 2016-07-22 NOTE — Progress Notes (Signed)
Patients heart rate drops into the 30's. Dr. Aundra Dubin notified and aware.

## 2016-07-22 NOTE — Progress Notes (Signed)
ANTICOAGULATION CONSULT NOTE - Initial Consult  Pharmacy Consult for warfarin Indication: atrial fibrillation  No Known Allergies  Patient Measurements: Height: 5\' 11"  (180.3 cm) Weight: 275 lb 12.8 oz (125.1 kg) (scale a) IBW/kg (Calculated) : 75.3  Vital Signs: Temp: 97.5 F (36.4 C) (11/15 1221) Temp Source: Oral (11/15 1221) BP: 99/79 (11/15 1221) Pulse Rate: 42 (11/15 1221)  Labs:  Recent Labs  07/21/16 2109 07/22/16 0205 07/22/16 0659 07/22/16 1228  HGB 11.6*  --   --   --   HCT 34.6*  --   --   --   PLT 155  --   --   --   LABPROT  --  30.0*  --   --   INR  --  2.78  --   --   CREATININE 1.09  --  1.14 1.25*  TROPONINI 3.05* 2.64* 2.45* 2.72*    Estimated Creatinine Clearance: 66.6 mL/min (by C-G formula based on SCr of 1.25 mg/dL (H)).   Medical History: Past Medical History:  Diagnosis Date  . A-fib ([place])   . Diabetes mellitus without complication ([place])   . High cholesterol   . Hypertension     Assessment: 30 yom with past medical history of afib on chronic warfarin. Patient is now being evaluated for ischemic cardiac disease and is planned to undergo cath once INR is down to less than 2.   INR this am is therapeutic at 2.7. D/w cardiology who desires the INR to be <2 for cardiac cath tomorrow. 2mg  of po vit k has been ordered, and a follow up INR has been ordered for this evening. Will repeat vitamin k if still elevated tonight to try and lower INR for procedure in am.   Goal of Therapy:  INR 2-3 Monitor platelets by anticoagulation protocol: Yes   Plan:  Hold warfarin until after cath Vit k this am  Recheck INR this evening  Erin Hearing PharmD., BCPS Clinical Pharmacist Pager [phone] 07/22/2016 3:07 PM

## 2016-07-22 NOTE — Progress Notes (Signed)
Nutrition Education Note  RD consulted for nutrition education regarding new onset CHF.  RD provided "Heart Failure Nutrition Therapy" handout from the Academy of Nutrition and Dietetics. Reviewed patient's dietary recall. Provided examples on ways to decrease sodium intake in diet. Discouraged intake of processed foods and use of salt shaker. Encouraged fresh fruits and vegetables as well as whole grain sources of carbohydrates to maximize fiber intake.   RD discussed why it is important for patient to adhere to diet recommendations, and emphasized foods to avoid and importance of weighing self daily. Teach back method used. Pt states that this will be challenging, especially this time of year. He reports eating on the road a lot, tailgating recently, and expects this will be challenging with the holidays coming up. RD discussed the importance of portion sizes and discussed tips for reducing sodium when eating out. RD encouraged pt to check out recipes on the American Heart Association website at http://www.tucker.net/.   Expect fair compliance.  Body mass index is 38.47 kg/m. Pt meets criteria for Obesity based on current BMI.  Current diet order is 2 Gram Sodium, patient is consuming approximately 100% of meals at this time. Labs and medications reviewed. No further nutrition interventions warranted at this time. RD contact information provided. If additional nutrition issues arise, please re-consult RD.   Scarlette Ar RD, CSP, LDN Inpatient Clinical Dietitian Pager: [phone] After Hours Pager: [phone]

## 2016-07-22 NOTE — Progress Notes (Signed)
Patient ID: Nicholas Harris, male   DOB: 12/29/1940, 75 y.o.   MRN: [id number]  Patient admitted earlier this morning with volume overload, junctional bradycardia.  He is diuresing with IV Lasix, HR stable in 40s.  INR 2.78 today so will defer cardiac cath until INR comes down.  Reassess tomorrow.   Will ask EP to see today given tachy-brady syndrome.    Loralie Champagne 07/22/2016 8:31 AM

## 2016-07-22 NOTE — Progress Notes (Signed)
Inpatient Diabetes Program Recommendations  AACE/ADA: New Consensus Statement on Inpatient Glycemic Control (2015)  Target Ranges:  Prepandial:   less than 140 mg/dL      Peak postprandial:   less than 180 mg/dL (1-2 hours)      Critically ill patients:  140 - 180 mg/dL   Lab Results  Component Value Date   GLUCAP 100 (H) 07/22/2016    Review of Glycemic Control Results for Nicholas Harris, Nicholas Harris (MRN [id number]) as of 07/22/2016 10:25  Ref. Range 07/22/2016 01:35 07/22/2016 06:42  Glucose-Capillary Latest Ref Range: 65 - 99 mg/dL 129 (H) 100 (H)   Diabetes history: DM2 Outpatient Diabetes medications: Amaryl 4 qd+ Metformin 1 gm bid Current orders for Inpatient glycemic control: Novolog correction 0-15 units tid +0-5 units hs  Inpatient Diabetes Program Recommendations:  Please consider A1c to determine prehospital glycemic control. Will follow.  Thank you, Nani Gasser. Donat Humble, RN, MSN, CDE Inpatient Glycemic Control Team Team Pager [phone] (8am-5pm) 07/22/2016 10:31 AM

## 2016-07-23 ENCOUNTER — Inpatient Hospital Stay (HOSPITAL_COMMUNITY): Payer: Medicare Other

## 2016-07-23 ENCOUNTER — Encounter (HOSPITAL_COMMUNITY)
Admission: EM | Disposition: A | Discharge status: Home / Self Care | Payer: Self-pay | Source: Home / Self Care | Attending: Cardiology

## 2016-07-23 ENCOUNTER — Encounter (HOSPITAL_COMMUNITY): Payer: Self-pay | Admitting: Cardiology

## 2016-07-23 ENCOUNTER — Ambulatory Visit (HOSPITAL_COMMUNITY): Admit: 2016-07-23 | Payer: Medicare Other | Admitting: Internal Medicine

## 2016-07-23 ENCOUNTER — Other Ambulatory Visit (HOSPITAL_COMMUNITY): Payer: Medicare Other

## 2016-07-23 DIAGNOSIS — I509 Heart failure, unspecified: Secondary | ICD-10-CM

## 2016-07-23 DIAGNOSIS — I495 Sick sinus syndrome: Secondary | ICD-10-CM

## 2016-07-23 HISTORY — PX: CARDIAC CATHETERIZATION: SHX172

## 2016-07-23 HISTORY — PX: EP IMPLANTABLE DEVICE: SHX172B

## 2016-07-23 LAB — CBC
HEMATOCRIT: 33.8 % — AB (ref 39.0–52.0)
Hemoglobin: 11.1 g/dL — ABNORMAL LOW (ref 13.0–17.0)
MCH: 28.1 pg (ref 26.0–34.0)
MCHC: 32.8 g/dL (ref 30.0–36.0)
MCV: 85.6 fL (ref 78.0–100.0)
Platelets: 157 10*3/uL (ref 150–400)
RBC: 3.95 MIL/uL — AB (ref 4.22–5.81)
RDW: 15.1 % (ref 11.5–15.5)
WBC: 8.1 10*3/uL (ref 4.0–10.5)

## 2016-07-23 LAB — BASIC METABOLIC PANEL
ANION GAP: 6 (ref 5–15)
BUN: 17 mg/dL (ref 6–20)
CO2: 26 mmol/L (ref 22–32)
Calcium: 9.1 mg/dL (ref 8.9–10.3)
Chloride: 107 mmol/L (ref 101–111)
Creatinine, Ser: 1.2 mg/dL (ref 0.61–1.24)
GFR calc Af Amer: >60 mL/min (ref >60)
GFR calc non Af Amer: 57 mL/min — ABNORMAL LOW (ref >60)
GLUCOSE: 108 mg/dL — AB (ref 65–99)
POTASSIUM: 3.6 mmol/L (ref 3.5–5.1)
Sodium: 139 mmol/L (ref 135–145)

## 2016-07-23 LAB — ECHOCARDIOGRAM COMPLETE
Height: 71 in
WEIGHTICAEL: 4278.4 [oz_av]

## 2016-07-23 LAB — POCT I-STAT 3, VENOUS BLOOD GAS (G3P V)
ACID-BASE EXCESS: 1 mmol/L (ref 0.0–2.0)
Bicarbonate: 25.6 mmol/L (ref 20.0–28.0)
Bicarbonate: 24.2 mmol/L (ref 20.0–28.0)
O2 SAT: 60 %
O2 SAT: 61 %
TCO2: 27 mmol/L (ref 0–100)
TCO2: 25 mmol/L (ref 0–100)
pCO2, Ven: 38.6 mmHg — ABNORMAL LOW (ref 44.0–60.0)
pCO2, Ven: 37.6 mmHg — ABNORMAL LOW (ref 44.0–60.0)
pH, Ven: 7.431 — ABNORMAL HIGH (ref 7.250–7.430)
pH, Ven: 7.417 (ref 7.250–7.430)
pO2, Ven: 31 mmHg — CL (ref 32.0–45.0)
pO2, Ven: 30 mmHg — CL (ref 32.0–45.0)

## 2016-07-23 LAB — PROTIME-INR
INR: 1.93
Prothrombin Time: 22.3 seconds — ABNORMAL HIGH (ref 11.4–15.2)

## 2016-07-23 LAB — GLUCOSE, CAPILLARY
GLUCOSE-CAPILLARY: 121 mg/dL — AB (ref 65–99)
GLUCOSE-CAPILLARY: 118 mg/dL — AB (ref 65–99)
Glucose-Capillary: 110 mg/dL — ABNORMAL HIGH (ref 65–99)
Glucose-Capillary: 131 mg/dL — ABNORMAL HIGH (ref 65–99)

## 2016-07-23 LAB — POCT ACTIVATED CLOTTING TIME
ACTIVATED CLOTTING TIME: 197 s
Activated Clotting Time: 164 seconds

## 2016-07-23 SURGERY — RIGHT/LEFT HEART CATH AND CORONARY ANGIOGRAPHY
Anesthesia: LOCAL

## 2016-07-23 SURGERY — PACEMAKER IMPLANT

## 2016-07-23 MED ORDER — IOPAMIDOL (ISOVUE-370) INJECTION 76%
INTRAVENOUS | Status: DC | PRN
  Administered 2016-07-23: 50 mL via INTRA_ARTERIAL

## 2016-07-23 MED ORDER — HEPARIN (PORCINE) IN NACL 2-0.9 UNIT/ML-% IJ SOLN
INTRAMUSCULAR | Status: DC | PRN
  Administered 2016-07-23: 11:00:00

## 2016-07-23 MED ORDER — HEPARIN (PORCINE) IN NACL 2-0.9 UNIT/ML-% IJ SOLN
INTRAMUSCULAR | Status: DC | PRN
  Administered 2016-07-23: 4000 mL

## 2016-07-23 MED ORDER — MIDAZOLAM HCL 5 MG/5ML IJ SOLN
INTRAMUSCULAR | Status: DC | PRN
  Administered 2016-07-23: 2 mg via INTRAVENOUS

## 2016-07-23 MED ORDER — HEPARIN (PORCINE) IN NACL 2-0.9 UNIT/ML-% IJ SOLN
INTRAMUSCULAR | Status: AC
  Filled 2016-07-23: qty 1000

## 2016-07-23 MED ORDER — CEFAZOLIN SODIUM-DEXTROSE 2-4 GM/100ML-% IV SOLN
2.0000 g | Freq: Four times a day (QID) | INTRAVENOUS | Status: AC
  Administered 2016-07-23 – 2016-07-24 (×3): 2 g via INTRAVENOUS
  Filled 2016-07-23 (×3): qty 100

## 2016-07-23 MED ORDER — IOPAMIDOL (ISOVUE-370) INJECTION 76%
INTRAVENOUS | Status: AC
  Filled 2016-07-23: qty 100

## 2016-07-23 MED ORDER — SODIUM CHLORIDE 0.9% FLUSH
3.0000 mL | Freq: Two times a day (BID) | INTRAVENOUS | Status: DC
  Administered 2016-07-23: 3 mL via INTRAVENOUS

## 2016-07-23 MED ORDER — ASPIRIN 81 MG PO CHEW
81.0000 mg | CHEWABLE_TABLET | ORAL | Status: AC
  Administered 2016-07-23: 81 mg via ORAL
  Filled 2016-07-23: qty 1

## 2016-07-23 MED ORDER — POTASSIUM CHLORIDE CRYS ER 20 MEQ PO TBCR
40.0000 meq | EXTENDED_RELEASE_TABLET | Freq: Once | ORAL | Status: AC
  Administered 2016-07-23: 40 meq via ORAL
  Filled 2016-07-23: qty 2

## 2016-07-23 MED ORDER — LIDOCAINE HCL (PF) 1 % IJ SOLN
INTRAMUSCULAR | Status: DC | PRN
  Administered 2016-07-23 (×2): 2 mL

## 2016-07-23 MED ORDER — SODIUM CHLORIDE 0.9 % IV SOLN
INTRAVENOUS | Status: DC
  Administered 2016-07-23: 07:00:00 via INTRAVENOUS

## 2016-07-23 MED ORDER — SODIUM CHLORIDE 0.9 % IR SOLN
80.0000 mg | Status: AC
  Administered 2016-07-23: 80 mg
  Filled 2016-07-23: qty 2

## 2016-07-23 MED ORDER — WARFARIN SODIUM 7.5 MG PO TABS
7.5000 mg | ORAL_TABLET | Freq: Once | ORAL | Status: AC
  Administered 2016-07-23: 7.5 mg via ORAL
  Filled 2016-07-23 (×2): qty 1

## 2016-07-23 MED ORDER — SODIUM CHLORIDE 0.9 % IV SOLN
INTRAVENOUS | Status: DC
  Administered 2016-07-23: 14:00:00 via INTRAVENOUS

## 2016-07-23 MED ORDER — MIDAZOLAM HCL 2 MG/2ML IJ SOLN
INTRAMUSCULAR | Status: DC | PRN
  Administered 2016-07-23: 1 mg via INTRAVENOUS

## 2016-07-23 MED ORDER — DEXTROSE 5 % IV SOLN
3.0000 g | INTRAVENOUS | Status: AC
  Administered 2016-07-23: 3 g via INTRAVENOUS
  Filled 2016-07-23 (×2): qty 3000

## 2016-07-23 MED ORDER — CEFAZOLIN SODIUM-DEXTROSE 2-4 GM/100ML-% IV SOLN
2.0000 g | Freq: Four times a day (QID) | INTRAVENOUS | Status: DC
  Filled 2016-07-23 (×3): qty 100

## 2016-07-23 MED ORDER — SODIUM CHLORIDE 0.9% FLUSH: 3.0000 mL | INTRAVENOUS | Status: DC | PRN

## 2016-07-23 MED ORDER — MIDAZOLAM HCL 2 MG/2ML IJ SOLN
INTRAMUSCULAR | Status: AC
  Filled 2016-07-23: qty 2

## 2016-07-23 MED ORDER — LIDOCAINE HCL (PF) 1 % IJ SOLN
INTRAMUSCULAR | Status: AC
  Filled 2016-07-23: qty 60

## 2016-07-23 MED ORDER — CHLORHEXIDINE GLUCONATE 4 % EX LIQD
60.0000 mL | Freq: Once | CUTANEOUS | Status: AC
  Administered 2016-07-23: 4 via TOPICAL
  Filled 2016-07-23: qty 60

## 2016-07-23 MED ORDER — WARFARIN - PHARMACIST DOSING INPATIENT
Freq: Every day | Status: DC
  Administered 2016-07-23 – 2016-07-24 (×2)

## 2016-07-23 MED ORDER — MIDAZOLAM HCL 5 MG/5ML IJ SOLN
INTRAMUSCULAR | Status: AC
  Filled 2016-07-23: qty 5

## 2016-07-23 MED ORDER — ACETAMINOPHEN 325 MG PO TABS: 325.0000 mg | ORAL_TABLET | ORAL | Status: DC | PRN

## 2016-07-23 MED ORDER — FENTANYL CITRATE (PF) 100 MCG/2ML IJ SOLN
INTRAMUSCULAR | Status: DC | PRN
  Administered 2016-07-23: 25 ug via INTRAVENOUS

## 2016-07-23 MED ORDER — LIDOCAINE HCL (PF) 1 % IJ SOLN
INTRAMUSCULAR | Status: DC | PRN
  Administered 2016-07-23: 35 mL via INTRADERMAL

## 2016-07-23 MED ORDER — FENTANYL CITRATE (PF) 100 MCG/2ML IJ SOLN
INTRAMUSCULAR | Status: AC
  Filled 2016-07-23: qty 2

## 2016-07-23 MED ORDER — SODIUM CHLORIDE 0.9 % IV SOLN: 250.0000 mL | INTRAVENOUS | Status: DC | PRN

## 2016-07-23 MED ORDER — ONDANSETRON HCL 4 MG/2ML IJ SOLN: 4.0000 mg | Freq: Four times a day (QID) | INTRAMUSCULAR | Status: DC | PRN

## 2016-07-23 MED ORDER — VERAPAMIL HCL 2.5 MG/ML IV SOLN
INTRAVENOUS | Status: AC
  Filled 2016-07-23: qty 2

## 2016-07-23 MED ORDER — LIDOCAINE HCL (PF) 1 % IJ SOLN
INTRAMUSCULAR | Status: AC
  Filled 2016-07-23: qty 30

## 2016-07-23 MED ORDER — METOLAZONE 2.5 MG PO TABS
2.5000 mg | ORAL_TABLET | Freq: Once | ORAL | Status: AC
  Administered 2016-07-23: 2.5 mg via ORAL
  Filled 2016-07-23: qty 1

## 2016-07-23 MED ORDER — VERAPAMIL HCL 2.5 MG/ML IV SOLN
INTRAVENOUS | Status: DC | PRN
  Administered 2016-07-23: 11:00:00 via INTRA_ARTERIAL

## 2016-07-23 MED ORDER — GENTAMICIN SULFATE 40 MG/ML IJ SOLN
INTRAMUSCULAR | Status: AC
  Filled 2016-07-23: qty 2

## 2016-07-23 MED ORDER — HEPARIN (PORCINE) IN NACL 2-0.9 UNIT/ML-% IJ SOLN
INTRAMUSCULAR | Status: DC | PRN
  Administered 2016-07-23: 15:00:00

## 2016-07-23 SURGICAL SUPPLY — 11 items
CATH 5FR JL3.5 JR4 ANG PIG MP (CATHETERS) ×2 IMPLANT
CATH BALLN WEDGE 5F 110CM (CATHETERS) ×2 IMPLANT
DEVICE RAD COMP TR BAND LRG (VASCULAR PRODUCTS) ×2 IMPLANT
GLIDESHEATH SLEND SS 6F .021 (SHEATH) ×2 IMPLANT
GUIDEWIRE INQWIRE 1.5J.035X260 (WIRE) ×1 IMPLANT
INQWIRE 1.5J .035X260CM (WIRE) ×2
KIT HEART LEFT (KITS) ×2 IMPLANT
PACK CARDIAC CATHETERIZATION (CUSTOM PROCEDURE TRAY) ×2 IMPLANT
SHEATH FAST CATH BRACH 5F 5CM (SHEATH) ×2 IMPLANT
TRANSDUCER W/STOPCOCK (MISCELLANEOUS) ×2 IMPLANT
TUBING CIL FLEX 10 FLL-RA (TUBING) ×2 IMPLANT

## 2016-07-23 SURGICAL SUPPLY — 9 items
CABLE SURGICAL S-101-97-12 (CABLE) ×3 IMPLANT
INGEVITY MRI 7741-52CM (Lead) ×3 IMPLANT
INGEVITY MRI 7742-59CM (Lead) ×3 IMPLANT
LEAD PACING INGEVITY MRI 52CM (Lead) ×1 IMPLANT
LEAD PACING INGEVITY MRI 59CM (Lead) ×1 IMPLANT
PACEMAKER ACCOLADE DR-EL (Pacemaker) ×3 IMPLANT
PAD DEFIB LIFELINK (PAD) ×3 IMPLANT
SHEATH CLASSIC 7F (SHEATH) ×6 IMPLANT
TRAY PACEMAKER INSERTION (PACKS) ×3 IMPLANT

## 2016-07-23 NOTE — Care Management Note (Signed)
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 He has scales at home but does not weigh himself daily. CM talked to the patient at length about daily weight and he stated that he would try to do better. Patient stated that he eats out at times and tries to eat healthy and low sodium. No needs identified at this time. CM will continue to follow pt for DCP.  Expected Discharge Date:     Possible 07/25/2016             Expected Discharge Plan:  Home/Self Care  Discharge planning Services  CM Consult  Choice offered to:  NA   Status of Service:  In process, will continue to follow  Sherrilyn Rist [phone] 07/23/2016, 1:56 PM

## 2016-07-23 NOTE — Progress Notes (Signed)
Paged for pt HR in 30s (lowest 36) while sleeping.   Pt currently awake and HR in 70s.   EP following and made aware.   No urgent need.  Full progress note to follow.     Legrand Como [address] [place], PA-C 07/23/2016 8:10 AM

## 2016-07-23 NOTE — Progress Notes (Signed)
SUBJECTIVE: The patient is doing well today.  At this time, he denies chest pain, shortness of breath, or any new concerns.  CURRENT MEDICATIONS: . aspirin  324 mg Oral Once  . aspirin EC  81 mg Oral Daily  . atorvastatin  20 mg Oral QHS  .  ceFAZolin (ANCEF) IV  3 g Intravenous To SSTC  . chlorhexidine  60 mL Topical Once  . finasteride  5 mg Oral Q breakfast  . furosemide  80 mg Intravenous BID  . gentamicin irrigation  80 mg Irrigation To SSTC  . guaiFENesin  600 mg Oral BID  . insulin aspart  0-15 Units Subcutaneous TID WC  . insulin aspart  0-5 Units Subcutaneous QHS  . lisinopril  20 mg Oral Q breakfast  . sodium chloride flush  3 mL Intravenous Q12H  . tamsulosin  0.8 mg Oral QPC breakfast   . sodium chloride      OBJECTIVE: Physical Exam: Vitals:   07/23/16 1235 07/23/16 1250 07/23/16 1307 07/23/16 1322  BP:  (!) 133/58 125/60 136/61  Pulse: (!) 34 (!) 41 (!) 51 (!) 44  Resp: 13  18 18   Temp:  97.6 F (36.4 C) 97.3 F (36.3 C) 98 F (36.7 C)  TempSrc:  Oral Oral Oral  SpO2: 98% 96% 97% 98%  Weight:      Height:        Intake/Output Summary (Last 24 hours) at 07/23/16 1404 Last data filed at 07/23/16 1231  Gross per 24 hour  Intake              480 ml  Output             2400 ml  Net            -1920 ml    Telemetry reveals junctional rhythm in the 30's  GEN- The patient is well appearing, alert and oriented x 3 today.   Head- normocephalic, atraumatic Eyes-  Sclera clear, conjunctiva pink Ears- hearing intact Oropharynx- clear Neck- supple  Lungs- Clear to ausculation bilaterally, normal work of breathing Heart- Bradycardic regular rate and rhythm  GI- soft, NT, ND, + BS Extremities- no clubbing, cyanosis, or edema Skin- no rash or lesion Psych- euthymic mood, full affect Neuro- strength and sensation are intact  LABS: Basic Metabolic Panel:  Recent Labs  07/22/16 1228 07/23/16 0331  NA 138 139  K 3.9 3.6  CL 105 107  CO2 22 26    GLUCOSE 188* 108*  BUN 11 17  CREATININE 1.25* 1.20  CALCIUM 9.2 9.1   Liver Function Tests:  Recent Labs  07/21/16 2109  AST 25  ALT 17  ALKPHOS 67  BILITOT 1.5*  PROT 7.2  ALBUMIN 4.2   CBC:  Recent Labs  07/21/16 2109 07/23/16 0331  WBC 8.6 8.1  NEUTROABS 6.2  --   HGB 11.6* 11.1*  HCT 34.6* 33.8*  MCV 84.6 85.6  PLT 155 157   Cardiac Enzymes:  Recent Labs  07/22/16 0205 07/22/16 0659 07/22/16 1228  TROPONINI 2.64* 2.45* 2.72*   Thyroid Function Tests:  Recent Labs  07/22/16 0205  TSH 3.343    RADIOLOGY: Dg Chest 2 View Result Date: 07/21/2016 CLINICAL DATA:  Subacute onset of dry cough and bilateral lower extremity swelling. 30 pounds of weight gain. Initial encounter. EXAM: CHEST  2 VIEW COMPARISON:  Chest radiograph performed 06/29/2016, and CT of the chest performed 06/24/2015 FINDINGS: The lungs are well-aerated. Mild vascular congestion is noted.  There is no evidence of focal opacification, pleural effusion or pneumothorax. The heart is mildly enlarged. No acute osseous abnormalities are seen. IMPRESSION: Mild vascular congestion and cardiomegaly. Lungs remain grossly clear. Electronically Signed   By: Garald Balding M.D.   On: 07/21/2016 21:17    ASSESSMENT AND PLAN:  Active Problems:   CHF (congestive heart failure) (HCC)   Acute CHF (congestive heart failure) (HCC)  1.  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 Echo with normal EF and cath without obstructive CAD. Will plan pacemaker implant later today. Risks, benefits reviewed with patient who wishes to proceed.  2.  Persistent atrial fibrillation Diagnosed 15 years ago and patient has been maintained on Warfarin but no AVN blocking agents Continue [place] long term for [facility] [place], NP 07/23/2016 2:06 PM  EP Attending  Patient seen and examined. Agree with the findings as noted above. The patient has  persistent sinus node dysfunction with HR's in the mid to upper 30's. He is symptomatic. I have discussed the risks/benefits/goals/expectations of PPM insertion with the patient and he wishes to proceed.   Mikle Bosworth.D.

## 2016-07-23 NOTE — Progress Notes (Signed)
ANTICOAGULATION CONSULT NOTE - Follow Up Consult  Pharmacy Consult for Coumadin Indication: atrial fibrillation  No Known Allergies  Patient Measurements: Height: 5\' 11"  (180.3 cm) Weight: 267 lb 6.4 oz (121.3 kg) (Scale A) IBW/kg (Calculated) : 75.3  Vital Signs: Temp: 98 F (36.7 C) (11/16 1322) Temp Source: Oral (11/16 1322) BP: 136/61 (11/16 1322) Pulse Rate: 44 (11/16 1322)  Labs:  Recent Labs  07/21/16 2109 07/22/16 0205 07/22/16 0659 07/22/16 1228 07/22/16 1759 07/23/16 0331  HGB 11.6*  --   --   --   --  11.1*  HCT 34.6*  --   --   --   --  33.8*  PLT 155  --   --   --   --  157  LABPROT  --  30.0*  --   --  26.1* 22.3*  INR  --  2.78  --   --  2.34 1.93  CREATININE 1.09  --  1.14 1.25*  --  1.20  TROPONINI 3.05* 2.64* 2.45* 2.72*  --   --     Estimated Creatinine Clearance: 68.3 mL/min (by C-G formula based on SCr of 1.2 mg/dL).  Assessment: 75yom on oumadin pta for afib.  INR 2.78 on admit, coumadin held and reversed (vit k 2mg  on 11/15) pending cath.  INR down to 1.93 today (notes say to start heparin when INR < 2 but heparin never started). He is now s/p L/RHC, currently getting a pacemaker. Coumadin to resume tonight.  Home dose: 5mg  daily except 2.5mg  on Sunday - last taken 11/14  Goal of Therapy:  INR 2-3 Monitor platelets by anticoagulation protocol: Yes   Plan:  1) Will give higher coumadin dose 7.5mg  tonight as INR likely to keep falling s/p vitamin k 2) Daily INR  Deboraha Sprang 07/23/2016,2:56 PM

## 2016-07-23 NOTE — Progress Notes (Signed)
  Echocardiogram 2D Echocardiogram has been performed.  Nicholas Harris 07/23/2016, 10:18 AM

## 2016-07-23 NOTE — Interval H&P Note (Signed)
History and Physical Interval Note:  07/23/2016 2:35 PM  Nicholas Harris  has presented today for surgery, with the diagnosis of sss  The various methods of treatment have been discussed with the patient and family. After consideration of risks, benefits and other options for treatment, the patient has consented to  Procedure(s): Pacemaker Implant (N/A) as a surgical intervention .  The patient's history has been reviewed, patient examined, no change in status, stable for surgery.  I have reviewed the patient's chart and labs.  Questions were answered to the patient's satisfaction.     Cristopher Peru

## 2016-07-23 NOTE — Progress Notes (Signed)
Orthopedic Tech Progress Note Patient Details:  Nicholas Harris [birth year] [id number] Patient already has arm sling. Patient ID: Nicholas Harris, male   DOB: 20-Aug-1941, 75 y.o.   MRN: [id number]   Braulio Bosch 07/23/2016, 8:11 PM

## 2016-07-23 NOTE — Progress Notes (Signed)
Right antecubital venous sheath removed and pressure held for 8 minutes. Site level zero.

## 2016-07-23 NOTE — H&P (View-Only) (Signed)
SUBJECTIVE: The patient is doing well today.  At this time, he denies chest pain, shortness of breath, or any new concerns.  CURRENT MEDICATIONS: . aspirin  324 mg Oral Once  . aspirin EC  81 mg Oral Daily  . atorvastatin  20 mg Oral QHS  .  ceFAZolin (ANCEF) IV  3 g Intravenous To SSTC  . chlorhexidine  60 mL Topical Once  . finasteride  5 mg Oral Q breakfast  . furosemide  80 mg Intravenous BID  . gentamicin irrigation  80 mg Irrigation To SSTC  . guaiFENesin  600 mg Oral BID  . insulin aspart  0-15 Units Subcutaneous TID WC  . insulin aspart  0-5 Units Subcutaneous QHS  . lisinopril  20 mg Oral Q breakfast  . sodium chloride flush  3 mL Intravenous Q12H  . tamsulosin  0.8 mg Oral QPC breakfast   . sodium chloride      OBJECTIVE: Physical Exam: Vitals:   07/23/16 1235 07/23/16 1250 07/23/16 1307 07/23/16 1322  BP:  (!) 133/58 125/60 136/61  Pulse: (!) 34 (!) 41 (!) 51 (!) 44  Resp: 13  18 18   Temp:  97.6 F (36.4 C) 97.3 F (36.3 C) 98 F (36.7 C)  TempSrc:  Oral Oral Oral  SpO2: 98% 96% 97% 98%  Weight:      Height:        Intake/Output Summary (Last 24 hours) at 07/23/16 1404 Last data filed at 07/23/16 1231  Gross per 24 hour  Intake              480 ml  Output             2400 ml  Net            -1920 ml    Telemetry reveals junctional rhythm in the 30's  GEN- The patient is well appearing, alert and oriented x 3 today.   Head- normocephalic, atraumatic Eyes-  Sclera clear, conjunctiva pink Ears- hearing intact Oropharynx- clear Neck- supple  Lungs- Clear to ausculation bilaterally, normal work of breathing Heart- Bradycardic regular rate and rhythm  GI- soft, NT, ND, + BS Extremities- no clubbing, cyanosis, or edema Skin- no rash or lesion Psych- euthymic mood, full affect Neuro- strength and sensation are intact  LABS: Basic Metabolic Panel:  Recent Labs  07/22/16 1228 07/23/16 0331  NA 138 139  K 3.9 3.6  CL 105 107  CO2 22 26    GLUCOSE 188* 108*  BUN 11 17  CREATININE 1.25* 1.20  CALCIUM 9.2 9.1   Liver Function Tests:  Recent Labs  07/21/16 2109  AST 25  ALT 17  ALKPHOS 67  BILITOT 1.5*  PROT 7.2  ALBUMIN 4.2   CBC:  Recent Labs  07/21/16 2109 07/23/16 0331  WBC 8.6 8.1  NEUTROABS 6.2  --   HGB 11.6* 11.1*  HCT 34.6* 33.8*  MCV 84.6 85.6  PLT 155 157   Cardiac Enzymes:  Recent Labs  07/22/16 0205 07/22/16 0659 07/22/16 1228  TROPONINI 2.64* 2.45* 2.72*   Thyroid Function Tests:  Recent Labs  07/22/16 0205  TSH 3.343    RADIOLOGY: Dg Chest 2 View Result Date: 07/21/2016 CLINICAL DATA:  Subacute onset of dry cough and bilateral lower extremity swelling. 30 pounds of weight gain. Initial encounter. EXAM: CHEST  2 VIEW COMPARISON:  Chest radiograph performed 06/29/2016, and CT of the chest performed 06/24/2015 FINDINGS: The lungs are well-aerated. Mild vascular congestion is noted.  There is no evidence of focal opacification, pleural effusion or pneumothorax. The heart is mildly enlarged. No acute osseous abnormalities are seen. IMPRESSION: Mild vascular congestion and cardiomegaly. Lungs remain grossly clear. Electronically Signed   By: Garald Balding M.D.   On: 07/21/2016 21:17    ASSESSMENT AND PLAN:  Active Problems:   CHF (congestive heart failure) (HCC)   Acute CHF (congestive heart failure) (HCC)  1.  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 Echo with normal EF and cath without obstructive CAD. Will plan pacemaker implant later today. Risks, benefits reviewed with patient who wishes to proceed.  2.  Persistent atrial fibrillation Diagnosed 15 years ago and patient has been maintained on Warfarin but no AVN blocking agents Continue [place] long term for [facility] [place], NP 07/23/2016 2:06 PM  EP Attending  Patient seen and examined. Agree with the findings as noted above. The patient has  persistent sinus node dysfunction with HR's in the mid to upper 30's. He is symptomatic. I have discussed the risks/benefits/goals/expectations of PPM insertion with the patient and he wishes to proceed.   Mikle Bosworth.D.

## 2016-07-23 NOTE — Progress Notes (Signed)
Patient's HR has been sustaining 30's -60'. Asymptomatic. Close to change of shift, dipped down 36 Spoke with [place] concerning the above HR.

## 2016-07-23 NOTE — Progress Notes (Signed)
Advanced Heart Failure Rounding Note  PCP: Ashby Dawes Primary Cardiologist: Luanna Cole  Subjective:    Admitted early am 07/22/16 with volume overload and junctional bradycardia.     HRs 40s mostly, down into 30s overnight.  This am HR in 60-70s walking around room. 40s at rest, occasionally upper 30s.  Feeling OK currently. No SOB at rest. Denies lightheadedness or dizziness. No CP. Wants to eat.   Out 1.6L overall. Weight shows down 8 lbs, ? Accuracy.   Objective:   Weight Range: 267 lb 6.4 oz (121.3 kg) Body mass index is 37.29 kg/m.   Vital Signs:   Temp:  [97.5 F (36.4 C)-98.4 F (36.9 C)] 98.3 F (36.8 C) (11/16 0538) Pulse Rate:  [42-48] 47 (11/16 0538) Resp:  [19-20] 19 (11/16 0538) BP: (99-153)/(61-79) 126/61 (11/16 0538) SpO2:  [94 %-100 %] 94 % (11/16 0538) Weight:  [267 lb 6.4 oz (121.3 kg)] 267 lb 6.4 oz (121.3 kg) (11/16 0538) Last BM Date: 07/22/16  Weight change: Filed Weights   07/21/16 1711 07/22/16 0013 07/23/16 0538  Weight: 288 lb (130.6 kg) 275 lb 12.8 oz (125.1 kg) 267 lb 6.4 oz (121.3 kg)    Intake/Output:   Intake/Output Summary (Last 24 hours) at 07/23/16 0809 Last data filed at 07/22/16 2200  Gross per 24 hour  Intake              480 ml  Output             1050 ml  Net             -570 ml     Physical Exam: General: Elderly, NAD Neck: Thick, JVP difficult but appears at least 12 cm. No thyromegaly or thyroid nodule noted. Lungs: Diminished basilar sounds.  CV: Nondisplaced PMI.  Heart regular, bradycardic S1/S2, no S3/S4, 2/6 HSM LLSB/apex.  1+ edema to knees bilaterally.  No carotid bruit.  Normal pedal pulses.  Abdomen: Soft, NT, ND, no HSM. No bruits or masses. +BS  Skin: Intact without lesions or rashes.  Neurologic: Alert and oriented x 3.  Psych: Normal affect. Extremities: No clubbing or cyanosis.  HEENT: Normal.   Telemetry: Reviewed, junctional brady in 40s at rest, occasionaly dipping into upper 30s (36  lowest seen).  When up and walking in room goes up to NSR 60-70s. EP aware.   Labs: CBC  Recent Labs  07/21/16 2109 07/23/16 0331  WBC 8.6 8.1  NEUTROABS 6.2  --   HGB 11.6* 11.1*  HCT 34.6* 33.8*  MCV 84.6 85.6  PLT 155 [id number]   Basic Metabolic Panel  Recent Labs  07/22/16 1228 07/23/16 0331  NA 138 139  K 3.9 3.6  CL 105 107  CO2 22 26  GLUCOSE 188* 108*  BUN 11 17  CREATININE 1.25* 1.20  CALCIUM 9.2 9.1   Liver Function Tests  Recent Labs  07/21/16 2109  AST 25  ALT 17  ALKPHOS 67  BILITOT 1.5*  PROT 7.2  ALBUMIN 4.2   No results for input(s): LIPASE, AMYLASE in the last 72 hours. Cardiac Enzymes  Recent Labs  07/22/16 0205 07/22/16 0659 07/22/16 1228  TROPONINI 2.64* 2.45* 2.72*    BNP: BNP (last 3 results)  Recent Labs  07/21/16 2110  BNP 478.0*    ProBNP (last 3 results) No results for input(s): PROBNP in the last 8760 hours.   D-Dimer No results for input(s): DDIMER in the last 72 hours. Hemoglobin A1C No results for input(s): HGBA1C  in the last 72 hours. Fasting Lipid Panel No results for input(s): CHOL, HDL, LDLCALC, TRIG, CHOLHDL, LDLDIRECT in the last 72 hours. Thyroid Function Tests  Recent Labs  07/22/16 0205  TSH 3.343    Other results:     Imaging/Studies:  Dg Chest 2 View  Result Date: 07/21/2016 CLINICAL DATA:  Subacute onset of dry cough and bilateral lower extremity swelling. 30 pounds of weight gain. Initial encounter. EXAM: CHEST  2 VIEW COMPARISON:  Chest radiograph performed 06/29/2016, and CT of the chest performed 06/24/2015 FINDINGS: The lungs are well-aerated. Mild vascular congestion is noted. There is no evidence of focal opacification, pleural effusion or pneumothorax. The heart is mildly enlarged. No acute osseous abnormalities are seen. IMPRESSION: Mild vascular congestion and cardiomegaly. Lungs remain grossly clear. Electronically Signed   By: Garald Balding M.D.   On: 07/21/2016 21:17      Latest Echo  Latest Cath   Medications:     Scheduled Medications: . aspirin  324 mg Oral Once  . aspirin EC  81 mg Oral Daily  . atorvastatin  20 mg Oral QHS  . finasteride  5 mg Oral Q breakfast  . furosemide  80 mg Intravenous BID  . guaiFENesin  600 mg Oral BID  . insulin aspart  0-15 Units Subcutaneous TID WC  . insulin aspart  0-5 Units Subcutaneous QHS  . lisinopril  20 mg Oral Q breakfast  . metolazone  2.5 mg Oral Once  . potassium chloride  40 mEq Oral Once  . sodium chloride flush  3 mL Intravenous Q12H  . sodium chloride flush  3 mL Intravenous Q12H  . tamsulosin  0.8 mg Oral QPC breakfast     Infusions: . sodium chloride 10 mL/hr at 07/23/16 0705     PRN Medications:  sodium chloride, sodium chloride, acetaminophen, ondansetron (ZOFRAN) IV, sodium chloride flush, sodium chloride flush   Assessment/Plan   75 yo with history of HTN, DM2, hyperlipidemia, and atrial fibrillation (presumed chronic) presented with bradycardia and acute CHF.  1. Acute CHF: No prior CHF diagnosis. Patient has had exertional dyspnea + cough + 30 lb weight gain.   - Remains volume overloaded on exam with NYHA Class III symptoms.    - Echocardiogram pending.  - Continue Lasix 80 mg IV BID with volume overload, will give dose of metolazone.  RHC today with LHC.  - Hold any nodal blockade with bradycardia.  - Continue lisinopril for now.   2. Elevated troponin: TnI up to 3.05 -> 2.6 -> 2.4 -> 2.7 - ?True NSTEMI with plaque rupture versus demand ischemia with CHF/volume overload.  TnI somewhat high to be demand ischemia.   - No chest pain.  No prior history of CAD though he has RFs (DM, HTN, hyperlipidemia).  - Plan R/LHC today.  Pt has discussed risks/benefits with MD.  - INR 1.9 this am. - ASA 81, continue statin.  3. Atrial fibrillation: Paroxysmal versus persistent.  Has been known for years, on warfarin at home.  No nodal blockade among home meds.  CHADSVASC = 5.  -  Rate improved to 70s this am, but overnight down into 30s. 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   - EP following this admission. May need PPM.  - INR 1.93. Planned for cath today. Heparin when INR < 2.  - No nodal blockers.  4. 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   Loletha Grayer into 30-40s but asymptomatic currently.  - EP following. Suspect he will need PPM.  5. Diabetes: - Will cover with SSI in house.    Length of Stay: 2  Shirley Friar PA-C 07/23/2016, 8:09 AM  Advanced Heart Failure Team Pager [phone] (M-F; 7a - 4p)  Please contact [facility] for night-coverage after hours (4p -7a ) and weekends on [email or web address]  1. Acute CHF: No prior CHF diagnosis. Patient has had exertional dyspnea + cough + 30 lb weight gain.  On exam, he is volume overloaded.  NYHA class III symptoms.  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  I doubt that mild bradycardia (HR 45-50 range) caused his CHF.  He also is noted to have TnI increased to 3.05 but with no chest pain.  Cannot rule out ischemic cardiomyopathy though TnI could also be elevated due to decompensated CHF (though TnI is rather high from demand ischemia alone).  Still volume overloaded today, UOP was not vigorous.  Creatinine stable.  - Awaiting echocardiogram.  - Lasix 80 mg IV bid, will add 1 dose metolazone 2.5 today.   - Hold any nodal blockade with bradycardia.  - Can continue lisinopril for now.  - RHC today.  2. Elevated troponin: TnI up to 3.05.  ?True NSTEMI with plaque rupture versus demand ischemia with CHF/volume overload.  TnI somewhat high to be demand ischemia.  No chest pain.  No prior history of CAD though he has RFs (DM, HTN, hyperlipidemia).   - LHC today. We discussed risks/benefits of cath and he agrees to proceed.  - Heparin gtt when INR < 2 until cath done.  - ASA 81, continue statin.  3. Atrial fibrillation: Paroxysmal  versus persistent.  Has been known for years, on warfarin at home.  No nodal blockade among home meds.  CHADSVASC = 5. Currently, he is in a junctional bradycardia with rate around 45.  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  He will likely need PPM, ?BiV device => will depend on EF. - Hold warfarin as above for cath, heparin when INR < 2.  - No nodal blockers.  4. 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  Suspect he will end up needing a PPM though currently, BP stable and no immediate indication for pacing.  5. Diabetes: Cover with sliding scale for now.   Loralie Champagne 07/23/2016 10:02 AM

## 2016-07-24 ENCOUNTER — Inpatient Hospital Stay (HOSPITAL_COMMUNITY): Payer: Medicare Other

## 2016-07-24 ENCOUNTER — Encounter (HOSPITAL_COMMUNITY): Payer: Self-pay | Admitting: Internal Medicine

## 2016-07-24 DIAGNOSIS — I5033 Acute on chronic diastolic (congestive) heart failure: Secondary | ICD-10-CM

## 2016-07-24 DIAGNOSIS — I495 Sick sinus syndrome: Principal | ICD-10-CM

## 2016-07-24 LAB — GLUCOSE, CAPILLARY
GLUCOSE-CAPILLARY: 109 mg/dL — AB (ref 65–99)
GLUCOSE-CAPILLARY: 101 mg/dL — AB (ref 65–99)
Glucose-Capillary: 126 mg/dL — ABNORMAL HIGH (ref 65–99)
Glucose-Capillary: 151 mg/dL — ABNORMAL HIGH (ref 65–99)

## 2016-07-24 LAB — CBC
HCT: 35.2 % — ABNORMAL LOW (ref 39.0–52.0)
Hemoglobin: 11.6 g/dL — ABNORMAL LOW (ref 13.0–17.0)
MCH: 28.1 pg (ref 26.0–34.0)
MCHC: 33 g/dL (ref 30.0–36.0)
MCV: 85.2 fL (ref 78.0–100.0)
PLATELETS: 163 10*3/uL (ref 150–400)
RBC: 4.13 MIL/uL — AB (ref 4.22–5.81)
RDW: 14.5 % (ref 11.5–15.5)
WBC: 7.8 10*3/uL (ref 4.0–10.5)

## 2016-07-24 LAB — BASIC METABOLIC PANEL
Anion gap: 11 (ref 5–15)
BUN: 19 mg/dL (ref 6–20)
CALCIUM: 9.2 mg/dL (ref 8.9–10.3)
CHLORIDE: 97 mmol/L — AB (ref 101–111)
CO2: 27 mmol/L (ref 22–32)
CREATININE: 1.22 mg/dL (ref 0.61–1.24)
GFR calc Af Amer: >60 mL/min (ref >60)
GFR calc non Af Amer: 55 mL/min — ABNORMAL LOW (ref >60)
GLUCOSE: 160 mg/dL — AB (ref 65–99)
Potassium: 3.2 mmol/L — ABNORMAL LOW (ref 3.5–5.1)
Sodium: 135 mmol/L (ref 135–145)

## 2016-07-24 LAB — PROTIME-INR
INR: 1.53
PROTHROMBIN TIME: 18.5 s — AB (ref 11.4–15.2)

## 2016-07-24 MED ORDER — METOLAZONE 2.5 MG PO TABS
2.5000 mg | ORAL_TABLET | Freq: Once | ORAL | Status: AC
  Administered 2016-07-24: 2.5 mg via ORAL
  Filled 2016-07-24: qty 1

## 2016-07-24 MED ORDER — POTASSIUM CHLORIDE CRYS ER 20 MEQ PO TBCR
60.0000 meq | EXTENDED_RELEASE_TABLET | Freq: Two times a day (BID) | ORAL | Status: AC
  Administered 2016-07-24 (×2): 60 meq via ORAL
  Filled 2016-07-24 (×2): qty 3

## 2016-07-24 MED ORDER — POTASSIUM CHLORIDE CRYS ER 20 MEQ PO TBCR: 40.0000 meq | EXTENDED_RELEASE_TABLET | Freq: Two times a day (BID) | ORAL | Status: DC

## 2016-07-24 MED ORDER — WARFARIN SODIUM 7.5 MG PO TABS
7.5000 mg | ORAL_TABLET | Freq: Once | ORAL | Status: AC
  Administered 2016-07-24: 7.5 mg via ORAL
  Filled 2016-07-24: qty 1

## 2016-07-24 NOTE — Care Management Important Message (Signed)
Important Message  Patient Details  Name: Nicholas Harris MRN: [id number] Date of Birth: Jan 09, 1941   Medicare Important Message Given:  Yes    Denney Shein 07/24/2016, 2:30 PM

## 2016-07-24 NOTE — Progress Notes (Signed)
Advanced Heart Failure Rounding Note  PCP: Ashby Dawes Primary Cardiologist: Luanna Cole EP: Dr. Lovena Le  Subjective:    Admitted early am 07/22/16 with volume overload and junctional bradycardia.     S/p [employer] PPM placement 07/23/16.  Feeling good this am.  Slightly sore around PPM site. Continues to pee a lot. Weight down with metolazone yesterday. Hasn't been up walking around much.   INR 1.53. No heparin with new PPM.    Out 5.8 L total. Weight shows down 19 lbs from admission.  Creatinine stable.  K 3.2 this am.   Echo 07/23/16 LVEF 65-70%, Mild MR, Mod LAE, RV moderately dilated/reduced, Severe RAE, Mod/Sev TR.  R/LHC 07/23/16 1. Angiography - No obstructive CAD. 2. Hemodynamics (mmHg) RA mean 19 with cannon a waves to 27 RV 47/16 PA 52/15, mean 27 PCWP mean 22 with prominent v-waves LV 123/18 AO 124/59  Oxygen saturations: PA 60% AO 97% Cardiac Output (Fick) 5.74  Cardiac Index (Fick) 2.38  Objective:   Weight Range: 254 lb 11.2 oz (115.5 kg) Body mass index is 35.52 kg/m.   Vital Signs:   Temp:  [97.3 F (36.3 C)-98.5 F (36.9 C)] 98.5 F (36.9 C) (11/17 0559) Pulse Rate:  [0-288] 50 (11/17 0559) Resp:  [0-100] 18 (11/17 0559) BP: (123-193)/(55-93) 193/87 (11/17 0559) SpO2:  [0 %-100 %] 94 % (11/17 0559) Weight:  [254 lb 11.2 oz (115.5 kg)] 254 lb 11.2 oz (115.5 kg) (11/17 0559) Last BM Date: 07/23/16  Weight change: Filed Weights   07/22/16 0013 07/23/16 0538 07/24/16 0559  Weight: 275 lb 12.8 oz (125.1 kg) 267 lb 6.4 oz (121.3 kg) 254 lb 11.2 oz (115.5 kg)    Intake/Output:   Intake/Output Summary (Last 24 hours) at 07/24/16 0758 Last data filed at 07/24/16 0600  Gross per 24 hour  Intake              320 ml  Output             4500 ml  Net            -4180 ml     Physical Exam: General: Elderly, NAD Neck: Thick, JVP appears up.  No thyromegaly or thyroid nodule noted. Lungs: Diminished basilar sounds.  CV: Nondisplaced  PMI.  Heart regular, bradycardic S1/S2, no S3/S4, 2/6 HSM LLSB/apex.  1-2+ edema 1/2 way to knees. No carotid bruit.  Normal pedal pulses.  Abdomen: Soft, NT, ND, no HSM. No bruits or masses. +BS  Skin: Intact without lesions or rashes.  Neurologic: Alert and oriented x 3.  Psych: Normal affect. Extremities: No clubbing or cyanosis.  HEENT: Normal.   Telemetry: Reviewed, Now A-Pacing in 50s  Labs: CBC  Recent Labs  07/21/16 2109 07/23/16 0331 07/24/16 0229  WBC 8.6 8.1 7.8  NEUTROABS 6.2  --   --   HGB 11.6* 11.1* 11.6*  HCT 34.6* 33.8* 35.2*  MCV 84.6 85.6 85.2  PLT 155 157 [id number]   Basic Metabolic Panel  Recent Labs  07/23/16 0331 07/24/16 0229  NA 139 135  K 3.6 3.2*  CL 107 97*  CO2 26 27  GLUCOSE 108* 160*  BUN 17 19  CREATININE 1.20 1.22  CALCIUM 9.1 9.2   Liver Function Tests  Recent Labs  07/21/16 2109  AST 25  ALT 17  ALKPHOS 67  BILITOT 1.5*  PROT 7.2  ALBUMIN 4.2   No results for input(s): LIPASE, AMYLASE in the last 72 hours. Cardiac Enzymes  Recent  Labs  07/22/16 0205 07/22/16 0659 07/22/16 1228  TROPONINI 2.64* 2.45* 2.72*    BNP: BNP (last 3 results)  Recent Labs  07/21/16 2110  BNP 478.0*    ProBNP (last 3 results) No results for input(s): PROBNP in the last 8760 hours.   D-Dimer No results for input(s): DDIMER in the last 72 hours. Hemoglobin A1C No results for input(s): HGBA1C in the last 72 hours. Fasting Lipid Panel No results for input(s): CHOL, HDL, LDLCALC, TRIG, CHOLHDL, LDLDIRECT in the last 72 hours. Thyroid Function Tests  Recent Labs  07/22/16 0205  TSH 3.343    Other results:     Imaging/Studies:  No results found.  Latest Echo  Latest Cath   Medications:     Scheduled Medications: . aspirin  324 mg Oral Once  . aspirin EC  81 mg Oral Daily  . atorvastatin  20 mg Oral QHS  .  ceFAZolin (ANCEF) IV  2 g Intravenous Q6H  . finasteride  5 mg Oral Q breakfast  . furosemide  80 mg  Intravenous BID  . guaiFENesin  600 mg Oral BID  . insulin aspart  0-15 Units Subcutaneous TID WC  . insulin aspart  0-5 Units Subcutaneous QHS  . lisinopril  20 mg Oral Q breakfast  . sodium chloride flush  3 mL Intravenous Q12H  . tamsulosin  0.8 mg Oral QPC breakfast  . Warfarin - Pharmacist Dosing Inpatient   Does not apply q1800    Infusions:   PRN Medications: sodium chloride, acetaminophen, ondansetron (ZOFRAN) IV, sodium chloride flush   Assessment/Plan   75 yo with history of HTN, DM2, hyperlipidemia, and atrial fibrillation (presumed chronic) presented with bradycardia and acute CHF.  1. Acute Diastolic CHF: No prior CHF diagnosis. Patient has had exertional dyspnea + cough + 30 lb weight gain.   - Echo 07/23/16 LVEF 65-70%, Mild MR, Mod LAE, RV moderately dilated/reduced, Severe RAE, Mod/Sev TR. - Remains volume overloaded on exam with NYHA Class III symptoms.    - Continue Lasix 80 mg IV BID with volume overload and will repeat dose of 2.5 mg metolazone this am.  - Continue lisinopril 20 mg daily.   2. Elevated troponin: TnI up to 3.05 -> 2.6 -> 2.4 -> 2.7 - Suspect demand ischemia with CHF/volume overload and marked bradycardia (possibly hypotensive at home).  - No obstructive CAD on cath. No CP.  3. Atrial fibrillation: Paroxysmal versus persistent.  Has been known for years, on warfarin at home.  No nodal blockade among home meds.  CHADSVASC = 5.  - Now s/p PPM.  - INR 1.53. On coumadin. No heparin bridge with fresh PPM site.   - No nodal blockers.  4. Junctional bradycardia:  - s/p [employer] Sci PPM placement 07/23/16 by Dr. Lovena Le.  5. Diabetes: - SSI.  6. Hypokalemia - 3.3 this am.  Will supp.   Diuresing as above. Repeat dose metolazone.  Possibly to po meds and home 24-48 hours.   Length of Stay: 3  Shirley Friar PA-C 07/24/2016, 7:58 AM  Advanced Heart Failure Team Pager [phone] (M-F; 7a - 4p)  Please contact [facility] for  night-coverage after hours (4p -7a ) and weekends on [email or web address]   Patient seen with PA, agree with the above note.  He is currently a-pacing, HR in 50s.  Feels better.  Diuresing well.  On exam, still volume overloaded.  Continue IV Lasix + metolazone today, reassess tomorrow but I suspect he may need 1 additional  day of IV diuretics.  When he goes home, would use Lasix 40 mg daily (was not on Lasix prior to admission) and followup with me in CHF clinic.   Elevated troponin appears to have been demand ischemia with only mild CAD.  He was markedly volume overloaded at admission and very bradycardic => suspect he must have had demand ischemia from hypoperfusion and volume overload.   Loralie Champagne 07/24/2016

## 2016-07-24 NOTE — Progress Notes (Signed)
Patient Name: Nicholas Harris Date of Encounter: 07/24/2016     Active Problems:   CHF (congestive heart failure) (HCC)   Acute CHF (congestive heart failure) ([place])    SUBJECTIVE  Feeling ok. No chest pain or sob. Dyspnea improved.  CURRENT MEDS . aspirin  324 mg Oral Once  . aspirin EC  81 mg Oral Daily  . atorvastatin  20 mg Oral QHS  .  ceFAZolin (ANCEF) IV  2 g Intravenous Q6H  . finasteride  5 mg Oral Q breakfast  . furosemide  80 mg Intravenous BID  . guaiFENesin  600 mg Oral BID  . insulin aspart  0-15 Units Subcutaneous TID WC  . insulin aspart  0-5 Units Subcutaneous QHS  . lisinopril  20 mg Oral Q breakfast  . metolazone  2.5 mg Oral Once  . potassium chloride  60 mEq Oral BID  . sodium chloride flush  3 mL Intravenous Q12H  . tamsulosin  0.8 mg Oral QPC breakfast  . warfarin  7.5 mg Oral ONCE-1800  . Warfarin - Pharmacist Dosing Inpatient   Does not apply q1800    OBJECTIVE  Vitals:   07/23/16 1609 07/23/16 2013 07/24/16 0012 07/24/16 0559  BP: (!) 148/78 (!) 193/93 (!) 144/65 (!) 193/87  Pulse: (!) 50 (!) 50 (!) 49 (!) 50  Resp:  (!) 22 18 18   Temp: 97.4 F (36.3 C) 98.4 F (36.9 C) 98 F (36.7 C) 98.5 F (36.9 C)  TempSrc: Oral Oral Oral Oral  SpO2: 98% 99% 99% 94%  Weight:    254 lb 11.2 oz (115.5 kg)  Height:        Intake/Output Summary (Last 24 hours) at 07/24/16 0845 Last data filed at 07/24/16 0600  Gross per 24 hour  Intake              320 ml  Output             4500 ml  Net            -4180 ml   Filed Weights   07/22/16 0013 07/23/16 0538 07/24/16 0559  Weight: 275 lb 12.8 oz (125.1 kg) 267 lb 6.4 oz (121.3 kg) 254 lb 11.2 oz (115.5 kg)    PHYSICAL EXAM  General: Pleasant, NAD. Neuro: Alert and oriented X 3. Moves all extremities spontaneously. Psych: Normal affect. HEENT:  Normal  Neck: Supple without bruits or JVD. Lungs:  Resp regular and unlabored, CTA. Incsion without hematoma. Heart: RRR no s3, s4, or  murmurs. Abdomen: Soft, non-tender, non-distended, BS + x 4.  Extremities: No clubbing, cyanosis or edema. DP/PT/Radials 2+ and equal bilaterally.  Accessory Clinical Findings  CBC  Recent Labs  07/21/16 2109 07/23/16 0331 07/24/16 0229  WBC 8.6 8.1 7.8  NEUTROABS 6.2  --   --   HGB 11.6* 11.1* 11.6*  HCT 34.6* 33.8* 35.2*  MCV 84.6 85.6 85.2  PLT 155 157 [id number]   Basic Metabolic Panel  Recent Labs  07/23/16 0331 07/24/16 0229  NA 139 135  K 3.6 3.2*  CL 107 97*  CO2 26 27  GLUCOSE 108* 160*  BUN 17 19  CREATININE 1.20 1.22  CALCIUM 9.1 9.2   Liver Function Tests  Recent Labs  07/21/16 2109  AST 25  ALT 17  ALKPHOS 67  BILITOT 1.5*  PROT 7.2  ALBUMIN 4.2   No results for input(s): LIPASE, AMYLASE in the last 72 hours. Cardiac Enzymes  Recent Labs  07/22/16  0205 07/22/16 0659 07/22/16 1228  TROPONINI 2.64* 2.45* 2.72*   BNP Invalid input(s): POCBNP D-Dimer No results for input(s): DDIMER in the last 72 hours. Hemoglobin A1C No results for input(s): HGBA1C in the last 72 hours. Fasting Lipid Panel No results for input(s): CHOL, HDL, LDLCALC, TRIG, CHOLHDL, LDLDIRECT in the last 72 hours. Thyroid Function Tests  Recent Labs  07/22/16 0205  TSH 3.343    TELE  NSR with atrial pacing  Radiology/Studies  Dg Chest 2 View  Result Date: 07/21/2016 CLINICAL DATA:  Subacute onset of dry cough and bilateral lower extremity swelling. 30 pounds of weight gain. Initial encounter. EXAM: CHEST  2 VIEW COMPARISON:  Chest radiograph performed 06/29/2016, and CT of the chest performed 06/24/2015 FINDINGS: The lungs are well-aerated. Mild vascular congestion is noted. There is no evidence of focal opacification, pleural effusion or pneumothorax. The heart is mildly enlarged. No acute osseous abnormalities are seen. IMPRESSION: Mild vascular congestion and cardiomegaly. Lungs remain grossly clear. Electronically Signed   By: Garald Balding M.D.   On: 07/21/2016  21:17    ASSESSMENT AND PLAN  1. Sinus node dysfunction 2. S/p PPM 3. Acute on chronic diastolic heart failure Rec: his PPM is working normally. We will arrange followup.   Carleene Overlie Taylor,M.D.  07/24/2016 8:45 AMPatient ID: Nicholas Harris, male   DOB: [birth year], 75 y.o.   MRN: [id number]

## 2016-07-24 NOTE — Progress Notes (Signed)
ANTICOAGULATION CONSULT NOTE - Follow Up Consult  Pharmacy Consult for Coumadin Indication: atrial fibrillation  No Known Allergies  Patient Measurements: Height: 5\' 11"  (180.3 cm) Weight: 254 lb 11.2 oz (115.5 kg) (scale a) IBW/kg (Calculated) : 75.3  Vital Signs: Temp: 98.5 F (36.9 C) (11/17 0559) Temp Source: Oral (11/17 0559) BP: 193/87 (11/17 0559) Pulse Rate: 50 (11/17 0559)  Labs:  Recent Labs  07/21/16 2109  07/22/16 0205 07/22/16 0659 07/22/16 1228 07/22/16 1759 07/23/16 0331 07/24/16 0229  HGB 11.6*  --   --   --   --   --  11.1* 11.6*  HCT 34.6*  --   --   --   --   --  33.8* 35.2*  PLT 155  --   --   --   --   --  157 163  LABPROT  --   < > 30.0*  --   --  26.1* 22.3* 18.5*  INR  --   < > 2.78  --   --  2.34 1.93 1.53  CREATININE 1.09  --   --  1.14 1.25*  --  1.20 1.22  TROPONINI 3.05*  --  2.64* 2.45* 2.72*  --   --   --   < > = values in this interval not displayed.  Estimated Creatinine Clearance: 65.6 mL/min (by C-G formula based on SCr of 1.22 mg/dL).  Assessment: 75yom on oumadin pta for afib.  INR 2.78 on admit, coumadin held and reversed (vit k 2mg  on 11/15) pending cath.  INR down to 1.93 yesterday and he underwent L/RHC and pacemaker placement. Coumadin resumed. INR continues to fall today 1.93 > 1.53. May require a few more higher doses to overcome vitamin k resistance.   Home dose: 5mg  daily except 2.5mg  on Sunday - last taken 11/14  Goal of Therapy:  INR 2-3 Monitor platelets by anticoagulation protocol: Yes   Plan:  1) Repeat coumadin 7.5mg  x 1 2) Daily INR 3) Hold off on heparin bridge per Dr. Caprice Red, Benjamine Sprague 07/24/2016,8:22 AM

## 2016-07-25 ENCOUNTER — Encounter (HOSPITAL_COMMUNITY): Payer: Self-pay | Admitting: General Practice

## 2016-07-25 DIAGNOSIS — I519 Heart disease, unspecified: Secondary | ICD-10-CM

## 2016-07-25 DIAGNOSIS — I480 Paroxysmal atrial fibrillation: Secondary | ICD-10-CM

## 2016-07-25 DIAGNOSIS — N400 Enlarged prostate without lower urinary tract symptoms: Secondary | ICD-10-CM

## 2016-07-25 DIAGNOSIS — I10 Essential (primary) hypertension: Secondary | ICD-10-CM

## 2016-07-25 DIAGNOSIS — E785 Hyperlipidemia, unspecified: Secondary | ICD-10-CM

## 2016-07-25 DIAGNOSIS — E119 Type 2 diabetes mellitus without complications: Secondary | ICD-10-CM

## 2016-07-25 DIAGNOSIS — I071 Rheumatic tricuspid insufficiency: Secondary | ICD-10-CM

## 2016-07-25 DIAGNOSIS — Z950 Presence of cardiac pacemaker: Secondary | ICD-10-CM

## 2016-07-25 DIAGNOSIS — I248 Other forms of acute ischemic heart disease: Secondary | ICD-10-CM

## 2016-07-25 DIAGNOSIS — E876 Hypokalemia: Secondary | ICD-10-CM

## 2016-07-25 DIAGNOSIS — R001 Bradycardia, unspecified: Secondary | ICD-10-CM

## 2016-07-25 DIAGNOSIS — N289 Disorder of kidney and ureter, unspecified: Secondary | ICD-10-CM

## 2016-07-25 DIAGNOSIS — I340 Nonrheumatic mitral (valve) insufficiency: Secondary | ICD-10-CM

## 2016-07-25 DIAGNOSIS — I5031 Acute diastolic (congestive) heart failure: Secondary | ICD-10-CM

## 2016-07-25 LAB — BASIC METABOLIC PANEL
Anion gap: 12 (ref 5–15)
BUN: 19 mg/dL (ref 6–20)
CALCIUM: 9.5 mg/dL (ref 8.9–10.3)
CHLORIDE: 93 mmol/L — AB (ref 101–111)
CO2: 31 mmol/L (ref 22–32)
CREATININE: 1.26 mg/dL — AB (ref 0.61–1.24)
GFR calc Af Amer: >60 mL/min (ref >60)
GFR calc non Af Amer: 53 mL/min — ABNORMAL LOW (ref >60)
GLUCOSE: 122 mg/dL — AB (ref 65–99)
Potassium: 4.2 mmol/L (ref 3.5–5.1)
Sodium: 136 mmol/L (ref 135–145)

## 2016-07-25 LAB — GLUCOSE, CAPILLARY
GLUCOSE-CAPILLARY: 132 mg/dL — AB (ref 65–99)
GLUCOSE-CAPILLARY: 159 mg/dL — AB (ref 65–99)

## 2016-07-25 LAB — PROTIME-INR
INR: 1.49
Prothrombin Time: 18.2 seconds — ABNORMAL HIGH (ref 11.4–15.2)

## 2016-07-25 MED ORDER — FUROSEMIDE 40 MG PO TABS: 40.0000 mg | ORAL_TABLET | Freq: Every day | ORAL | 3 refills | Status: DC

## 2016-07-25 MED ORDER — FUROSEMIDE 40 MG PO TABS: 40.0000 mg | ORAL_TABLET | Freq: Every day | ORAL | Status: DC

## 2016-07-25 NOTE — Discharge Instructions (Signed)
° ° °  Supplemental Discharge Instructions for  Pacemaker Patients  Activity No heavy lifting or vigorous activity with your left/right arm for 6 to 8 weeks.  Do not raise your left/right arm above your head for one week.  Gradually raise your affected arm as drawn below.           __        07/28/16               07/29/16                  07/30/16                 07/31/16  NO DRIVING for   1 week  ; you may begin driving on    [id number] .  WOUND CARE - Keep the wound area clean and dry.  Do not get this area wet for one week. No showers for one week; you may shower on 07/31/16    . No tub baths, pools or beach water until cleared by your doctor. Please discuss at wound check. - The tape/steri-strips on your wound will fall off; do not pull them off.  No bandage is needed on the site.  DO  NOT apply any creams, oils, or ointments to the wound area. - If you notice any drainage or discharge from the wound, any swelling or bruising at the site, or you develop a fever > 101? F after you are discharged home, call the office at once.  Special Instructions - You are still able to use cellular telephones; use the ear opposite the side where you have your pacemaker.  Avoid carrying your cellular phone near your device. - When traveling through airports, show security personnel your identification card to avoid being screened in the metal detectors.  Ask the security personnel to use the hand wand. - Avoid arc welding equipment, MRI testing (magnetic resonance imaging), TENS units (transcutaneous nerve stimulators).  Call the office for questions about other devices. - Avoid electrical appliances that are in poor condition or are not properly grounded. - Microwave ovens are safe to be near or to operate.

## 2016-07-25 NOTE — Progress Notes (Signed)
Pt given discharge instructions.Verbalized understanding,  Questions answered. IV and tele removed. Belongings packed and taken with pt. HF and Pacemaker education complete.

## 2016-07-25 NOTE — Progress Notes (Signed)
Pt refused bed alarm on. Will continue to do hourly rounding. 

## 2016-07-25 NOTE — Discharge Summary (Signed)
Discharge Summary    Patient ID: Nicholas Harris,  MRN: [id number], DOB/AGE: 1941/08/24 75 y.o.  Admit date: 07/21/2016 Discharge date: 07/25/2016  Primary Care Provider: [facility] Primary Cardiologist: Dr. Aundra Dubin (CHF), Dr. Lovena Le (EP)  Discharge Diagnoses    Principal Problem:   Acute diastolic CHF (congestive heart failure) ([place]) Active Problems:   Junctional bradycardia   Paroxysmal atrial fibrillation (HCC)   Right ventricular dysfunction   S/P placement of cardiac pacemaker   Renal insufficiency   Essential hypertension   Diabetes mellitus ([place])   Hyperlipidemia   BPH (benign prostatic hyperplasia)   Hypokalemia   Mitral regurgitation   Tricuspid regurgitation   Demand ischemia (HCC)   Diagnostic Studies/Procedures    1. Cardiac cath 07/23/16 - Conclusion  1. Elevated R > L heart filling pressures with preserved cardiac output.  2. No obstructive CAD.  Dominance: Right  Left Main  No significant disease.  Left Anterior Descending  Luminal irregularities. There appears to be mild myocardial bridging in the mid LAD.  Left Circumflex  Luminal irregularities.  Right Coronary Artery  Somewhat slowed flow down the RCA. There were luminal irregularities but no significant stenosis.     2. PPM implantation 07/23/16  _____________     History of Present Illness     Nicholas Harris is a 75 y.o. male with history of HTN, DM2, hyperlipidemia, BPH and atrial fibrillation (determined paroxysmal by end of hospital stay) presented with bradycardia and acute CHF. It's unclear if his afib was persistent or paroxysmal - was diagnosed at [place] in [place] several years ago and is now followed by his PCP. He has been on warfarin at home.   Hospital Course    For 3 weeks prior to admission he had developed cough, weight gain of 30lbs, LEE, and dyspnea on exertion. Due to persistent sx, he presented to [facility] where he was found to have junctional bradycardia  45-50bpm, occasional dips into 30s as well as CHF. His troponin was elevated to 3.05, initially unclear if NSTEMI or demand ischemia related to acute heart failure. He was admitted for further evaluation and started on IV Lasix for diuresis, episodically requiring augmentation with metolazone. His amlodipine was stopped on admission. Coumadin was held with heparin crossover in prep for cath.  2D Echo 07/23/16 showing mild LVH, EF 65-70%, mild MR, mod LAE, mod dilated RV with moderately reduced RV function, mod-severe TR. He underwent [facility] 07/23/16 elevated R > L heart filling pressures with preserved cardiac output, no obstructive CAD. He was seen by EP this admission who felt he would benefit from permanent pacemaker - on 07/23/16 underwent [facility] (serial number [id number]) pacemaker implantation. His coumadin was restarted without heparin bridge given fresh implantation of device. With aggressive diuresis his weight went from 288lb on admission to 247lb on day of discharge, -8.6L. His labs this AM are notable for K 4.2, Cr 1.26 - his Cr tended to fluctuate from 1-1.2 range this AM so this may be his baseline. Dr. Stanford Breed has seen and examined the patient today and feels he is stable for discharge. He was advised to restart Metformin tomorrow morning given cath 07/23/16. He will go home on Lasix 40mg  daily. Dr. Stanford Breed recommends f/u BMET in 1 week with f/u in CHF clinic. He will also need follow-up of his INR in 1 week. I have sent a message to the CHF clinic to help arrange these things - the patient states he follows at [facility] for his INR.  Their office is closed but will ask the help of the CHF clinic nursing staff to help arrange. The patient also already has EP f/u arranged with wound check on 08/07/16 and Dr. Lovena Le in 10/2016. _____________  Discharge Vitals Blood pressure (!) 124/52, pulse (!) 50, temperature 98.1 F (36.7 C), temperature source Oral, resp. rate 20, height 5\' 11"  (1.803  m), weight 247 lb 9.6 oz (112.3 kg), SpO2 97 %.  Filed Weights   07/23/16 0538 07/24/16 0559 07/25/16 0550  Weight: 267 lb 6.4 oz (121.3 kg) 254 lb 11.2 oz (115.5 kg) 247 lb 9.6 oz (112.3 kg)    Labs & Radiologic Studies    CBC  Recent Labs  07/23/16 0331 07/24/16 0229  WBC 8.1 7.8  HGB 11.1* 11.6*  HCT 33.8* 35.2*  MCV 85.6 85.2  PLT 157 [id number]   Basic Metabolic Panel  Recent Labs  07/24/16 0229 07/25/16 0340  NA 135 136  K 3.2* 4.2  CL 97* 93*  CO2 27 31  GLUCOSE 160* 122*  BUN 19 19  CREATININE 1.22 1.26*  CALCIUM 9.2 9.5   Cardiac Enzymes  Recent Labs  07/22/16 1228  TROPONINI 2.72*   _____________  Dg Chest 2 View  Result Date: 07/24/2016 CLINICAL DATA:  Post pacemaker insertion. EXAM: CHEST  2 VIEW COMPARISON:  07/21/2016 FINDINGS: There is a left dual chamber cardiac pacemaker. Leads in the region of the right atrium and right ventricle. Heart size is upper limits of normal but stable. The trachea is midline. Negative for pulmonary edema or focal airspace disease. Minimal scarring or atelectasis at the left lung base. Negative for a pneumothorax. Degenerative changes in thoracic spine. No pleural effusions. IMPRESSION: Placement of a left dual chamber cardiac pacemaker without complicating features. Electronically Signed   By: Markus Daft M.D.   On: 07/24/2016 10:00   Dg Chest 2 View  Result Date: 07/21/2016 CLINICAL DATA:  Subacute onset of dry cough and bilateral lower extremity swelling. 30 pounds of weight gain. Initial encounter. EXAM: CHEST  2 VIEW COMPARISON:  Chest radiograph performed 06/29/2016, and CT of the chest performed 06/24/2015 FINDINGS: The lungs are well-aerated. Mild vascular congestion is noted. There is no evidence of focal opacification, pleural effusion or pneumothorax. The heart is mildly enlarged. No acute osseous abnormalities are seen. IMPRESSION: Mild vascular congestion and cardiomegaly. Lungs remain grossly clear. Electronically  Signed   By: Garald Balding M.D.   On: 07/21/2016 21:17   Disposition   Pt is being discharged home today in good condition.  Follow-up Plans & Appointments    Follow-up Information    Cristopher Peru, MD Follow up on 10/23/2016.   Specialty:  Cardiology Why:  at 8:45AM (pacemaker doctor) Contact information: [id number] [place] [address] [phone]        [place] [facility] Follow up.   Why:  For pacemaker wound check on 12/1 at noon.       Loralie Champagne, MD Follow up.   Specialty:  Cardiology Why:  The Heart Failure clinic will be calling you for your follow-up appointment to be seen in 1 week with labwork. They will also be helping to arrange your Coumadin level follow-up. Call if you have not heard back within 2 days. Contact information: [address] [address] [phone]          Discharge Instructions    Diet - low sodium heart healthy    Complete by:  As directed    Increase activity slowly    Complete by:  As directed    You may restart your Metformin tomorrow morning. No lifting over 5 lbs for one week - see pacemaker discharge instructions at the end of AVS regarding heavier lifting, bathing, and pacemaker care. No sexual activity for 1 week. Keep cath procedure site clean & dry. If you notice increased pain, swelling, bleeding or pus, call/return!      Discharge Medications     Medication List    STOP taking these medications   amLODipine 5 MG tablet Commonly known as:  NORVASC     TAKE these medications   atorvastatin 20 MG tablet Commonly known as:  LIPITOR Take 20 mg by mouth at bedtime.   finasteride 5 MG tablet Commonly known as:  PROSCAR Take 5 mg by mouth daily with breakfast.   furosemide 40 MG tablet Commonly known as:  LASIX Take 1 tablet (40 mg total) by mouth daily. Start taking on:  07/26/2016   glimepiride 4 MG tablet Commonly known as:  AMARYL Take  4 mg by mouth daily with breakfast.   guaiFENesin 600 MG 12 hr tablet Commonly known as:  MUCINEX Take 600 mg by mouth 2 (two) times daily.   lidocaine 5 % Commonly known as:  LIDODERM Place 1 patch onto the skin once. Remove & Discard patch within 12 hours or as directed by MD   lisinopril 20 MG tablet Commonly known as:  PRINIVIL,ZESTRIL Take 20 mg by mouth daily with breakfast.   metFORMIN 500 MG 24 hr tablet Commonly known as:  GLUCOPHAGE-XR Take 1,000 mg by mouth 2 (two) times daily. Notes to patient:  You may restart your Metformin tomorrow morning 07/26/16.   tamsulosin 0.4 MG Caps capsule Commonly known as:  FLOMAX Take 0.8 mg by mouth daily after breakfast.   vitamin C 1000 MG tablet Take 1,000 mg by mouth daily.   warfarin 5 MG tablet Commonly known as:  COUMADIN Take 2.5-5 mg by mouth daily. Takes 5mg  everyday except Sunday takes 2.5mg         Allergies:  No Known Allergies   Outstanding Labs/Studies   See above  Duration of Discharge Encounter   Greater than 30 minutes including physician time.  Signed, Charlie Pitter PA-C 07/25/2016, 12:25 PM

## 2016-07-25 NOTE — Progress Notes (Signed)
Advanced Heart Failure Rounding Note  PCP: Ashby Dawes Primary Cardiologist: Luanna Cole EP: Dr. Lovena Le  Subjective:   No Chest pain or dyspnea  Admitted early am 07/22/16 with volume overload and junctional bradycardia.     S/p Boston Sci PPM placement 07/23/16.  INR 1.53. No heparin with new PPM.    I/O -4140  Echo 07/23/16 LVEF 65-70%, Mild MR, Mod LAE, RV moderately dilated/reduced, Severe RAE, Mod/Sev TR.  R/LHC 07/23/16 1. Angiography - No obstructive CAD. 2. Hemodynamics (mmHg) RA mean 19 with cannon a waves to 27 RV 47/16 PA 52/15, mean 27 PCWP mean 22 with prominent v-waves LV 123/18 AO 124/59  Oxygen saturations: PA 60% AO 97% Cardiac Output (Fick) 5.74  Cardiac Index (Fick) 2.38  Objective:   Weight Range: 247 lb 9.6 oz (112.3 kg) Body mass index is 34.53 kg/m.   Vital Signs:   Temp:  [97.6 F (36.4 C)-99.5 F (37.5 C)] 98 F (36.7 C) (11/18 0550) Pulse Rate:  [50-57] 50 (11/18 0550) Resp:  [18] 18 (11/18 0550) BP: (105-135)/(41-56) 105/54 (11/18 0550) SpO2:  [98 %-100 %] 98 % (11/18 0550) Weight:  [247 lb 9.6 oz (112.3 kg)] 247 lb 9.6 oz (112.3 kg) (11/18 0550) Last BM Date: 07/24/16  Weight change: Filed Weights   07/23/16 0538 07/24/16 0559 07/25/16 0550  Weight: 267 lb 6.4 oz (121.3 kg) 254 lb 11.2 oz (115.5 kg) 247 lb 9.6 oz (112.3 kg)    Intake/Output:   Intake/Output Summary (Last 24 hours) at 07/25/16 [id number] Last data filed at 07/25/16 0604  Gross per 24 hour  Intake             1160 ml  Output             5300 ml  Net            -4140 ml     Physical Exam: General: Elderly, NAD Neck: supple Lungs: CTA CV: RRR Abdomen: Soft, NT, ND, no HSM Skin: Warm and dry Neurologic: grossly intact Psych: Normal affect. Extremities: No edema HEENT: Normal.   Telemetry: Reviewed, intermittent atrial pacing  Labs: CBC  Recent Labs  07/23/16 0331 07/24/16 0229  WBC 8.1 7.8  HGB 11.1* 11.6*  HCT 33.8* 35.2*  MCV 85.6  85.2  PLT 157 [id number]   Basic Metabolic Panel  Recent Labs  07/24/16 0229 07/25/16 0340  NA 135 136  K 3.2* 4.2  CL 97* 93*  CO2 27 31  GLUCOSE 160* 122*  BUN 19 19  CREATININE 1.22 1.26*  CALCIUM 9.2 9.5   Cardiac Enzymes  Recent Labs  07/22/16 1228  TROPONINI 2.72*    BNP: BNP (last 3 results)  Recent Labs  07/21/16 2110  BNP 478.0*    Imaging/Studies:  Dg Chest 2 View  Result Date: 07/24/2016 CLINICAL DATA:  Post pacemaker insertion. EXAM: CHEST  2 VIEW COMPARISON:  07/21/2016 FINDINGS: There is a left dual chamber cardiac pacemaker. Leads in the region of the right atrium and right ventricle. Heart size is upper limits of normal but stable. The trachea is midline. Negative for pulmonary edema or focal airspace disease. Minimal scarring or atelectasis at the left lung base. Negative for a pneumothorax. Degenerative changes in thoracic spine. No pleural effusions. IMPRESSION: Placement of a left dual chamber cardiac pacemaker without complicating features. Electronically Signed   By: Markus Daft M.D.   On: 07/24/2016 10:00     Medications:     Scheduled Medications: . aspirin  324  mg Oral Once  . atorvastatin  20 mg Oral QHS  . finasteride  5 mg Oral Q breakfast  . furosemide  80 mg Intravenous BID  . guaiFENesin  600 mg Oral BID  . insulin aspart  0-15 Units Subcutaneous TID WC  . insulin aspart  0-5 Units Subcutaneous QHS  . lisinopril  20 mg Oral Q breakfast  . sodium chloride flush  3 mL Intravenous Q12H  . tamsulosin  0.8 mg Oral QPC breakfast  . Warfarin - Pharmacist Dosing Inpatient   Does not apply q1800    Infusions:   PRN Medications: sodium chloride, acetaminophen, ondansetron (ZOFRAN) IV, sodium chloride flush   Assessment/Plan   75 yo with history of HTN, DM2, hyperlipidemia, and atrial fibrillation (presumed chronic) presented with bradycardia and acute CHF.  1. Acute Diastolic CHF: - Echo [id number] LVEF 65-70%, Mild MR, Mod LAE, RV  moderately dilated/reduced, Severe RAE, Mod/Sev TR. - Volume status improved. Will discharge today on Lasix 40 mg daily. Check potassium and renal function in 1 week. Follow-up with Dr. Aundra Dubin in CHF clinic. 2. Elevated troponin: TnI up to 3.05 -> 2.6 -> 2.4 -> 2.7 - No obstructive CAD on cath. No CP.  3. Atrial fibrillation: Paroxysmal. - Now s/p PPM.  - INR 1.53. On coumadin (continue home dose at DC; FU INR one week). No heparin bridge with fresh PPM site.   4. Junctional bradycardia:  - s/p Boston Sci PPM placement 07/23/16 by Dr. Lovena Le. FU with Dr Lovena Le at [place] 5. Diabetes: - Per IM > 30 min PA and phyician time D2 Kirk Ruths MD 07/25/2016, 9:22 AM

## 2016-07-27 ENCOUNTER — Telehealth (HOSPITAL_COMMUNITY): Payer: Self-pay | Admitting: Cardiology

## 2016-07-27 NOTE — Telephone Encounter (Signed)
1. Left message with answer service, nurse will return call to arrange inr appt  2. HFU scheduled with Rebecca Eaton 08/06/16 @ 1320 (left message for patient regarding appt)  3. Noted follow up with EP

## 2016-07-27 NOTE — Telephone Encounter (Signed)
-----   Message from Charlie Pitter, [place] sent at 07/25/2016 12:10 PM EST ----- Regarding: Needs f/u This patient was admitted by the CHF team/[place], being discharged today (Saturday) - needs the following:  - please help arrange INR check at [facility] in 1 week (patient is followed in their Coumadin clinic) - needs f/u in 1 week with CHF team with BMET at that time. If unable to accomodate in CHF clinic, OK to schedule at [facility] - this should be a transition of care visit. - pt already has f/u with EP  Please call him with this information, thanks! Dayna Dunn PA-C

## 2016-07-28 NOTE — Telephone Encounter (Signed)
Nurse with [facility] returned call about scheduling an appointment. Reports she will reach out to the patient, and get him a follow up appointment with INR check. Last OV is 09/2015.

## 2016-07-31 ENCOUNTER — Other Ambulatory Visit: Payer: Self-pay

## 2016-07-31 NOTE — Patient Outreach (Addendum)
[facility]) Care Management  07/31/2016  CRISTON BOKOR 1940-09-20 [id number]      EMMI-HF RED ON EMMI ALERT Day # 1 Date:  07/29/16 Red Alert Reason: "Scheduled follow up appt? No"    Outreach attempt #1 to patient. No answer at present. RN CM left HIPAA compliant voicemail message along with contact info.     Plan: RN CM will make outreach attempt to patient within three business days if no return call from patient.   [name], RN,BSN,CCM [place] Management Telephonic Care Management Coordinator Direct Phone: [phone] Toll Free: [phone] Fax: [phone]

## 2016-08-03 ENCOUNTER — Other Ambulatory Visit: Payer: Self-pay

## 2016-08-03 NOTE — Patient Outreach (Signed)
[facility]) Care Management  08/03/2016  Nicholas Harris 09/25/1940 [id number]   EMMI-HF RED ON EMMI ALERT Day # 1 Date:  07/29/16 Red Alert Reason: "Scheduled follow up appt? No"    Outreach attempt #2. Spoke with patient and addressed red alert. Patient voices that he is confused about his appts and no sure where and when he should go. RN CM reviewed with patient appts scheduled per discharge instructions. Patient voiced understanding and wrote information down. He has not made f/u appt with PCP and states he was not aware he needed to see PCP. Advised patient that its encouraged and recommended to f/u with PCP within 14 days from hospital admission. Patient verbalized he will call PCP office today to make appt and he will f/u with office regarding his Coumadin lab work. Patient has transportation to appts. Denies any issues with meds or any other RN CM needs at this time.    Follow-up Plans & Appointments       Follow-up Information    Cristopher Peru, MD Follow up on 10/23/2016.   Specialty:  Cardiology Why:  at 8:45AM (pacemaker doctor) Contact information: [id number] [address] [phone]        [place] Office Follow up.   Why:  For pacemaker wound check on 12/1 at noon.       Loralie Champagne, MD Follow up.   Specialty:  Cardiology Why:  The Heart Failure clinic will be calling you for your follow-up appointment to be seen in 1 week with labwork. They will also be helping to arrange your Coumadin level follow-up. Call if you have not heard back within 2 days. Contact information: [address] [place] [address] [phone]    HFU scheduled with Rebecca Eaton 08/06/16 @ 1320   Plan: RN CM will make outreach call to patient within a week.   [name], RN,BSN,CCM [place] Management Telephonic Care Management Coordinator Direct Phone:  [phone] Toll Free: [phone] Fax: [phone]

## 2016-08-06 ENCOUNTER — Inpatient Hospital Stay (HOSPITAL_COMMUNITY): Admission: RE | Admit: 2016-08-06 | Payer: Medicare Other | Source: Ambulatory Visit

## 2016-08-06 ENCOUNTER — Other Ambulatory Visit: Payer: Self-pay

## 2016-08-06 DIAGNOSIS — Z7901 Long term (current) use of anticoagulants: Secondary | ICD-10-CM | POA: Diagnosis not present

## 2016-08-06 DIAGNOSIS — I4891 Unspecified atrial fibrillation: Secondary | ICD-10-CM | POA: Diagnosis not present

## 2016-08-06 NOTE — Patient Outreach (Signed)
[facility]) Care Management  08/06/2016  Nicholas Harris [birth year] [id number]     EMMI-HF RED ON EMMI ALERT Day # 8 Date: 08/05/16 Red Alert Reason: "Went to follow up appt? No"    Outreach attempt #1 to patient. No answer at present. RN CM left HIPAA compliant voicemail along with contact info.     Plan: RN CM will make outreach attempt to patient within three business days.    [name], RN,BSN,CCM [place] Management Telephonic Care Management Coordinator Direct Phone: [phone] Toll Free: [phone] Fax: [phone]

## 2016-08-07 ENCOUNTER — Other Ambulatory Visit: Payer: Self-pay

## 2016-08-07 ENCOUNTER — Ambulatory Visit: Payer: Medicare Other

## 2016-08-07 NOTE — Patient Outreach (Signed)
[facility] [facility]) Care Management  08/07/2016  Nicholas Harris [birth year] [id number]   EMMI-HF Follow Up Call RED ON EMMI ALERT Day # 8 Date: 08/05/16 Red Alert Reason: "Went to follow up appt? No"   Outreach call to patient. Spoke with patient. He states that he went yesterday to [facility] and had INR checked. He goes back in a month. He reports that he did not go to f/u appt with Cathrine Muster that was scheduled for yesterday as he got confused about appt day/time. Advised patient to contact office and reschedule appt. He voiced understanding. patient reported that he received letter in the mail yesterday advising of pacer/wound heck appt with [facility] on 08/10/16 at 4:30pm. He reports that his discharge paperwork has appt for today 08/07/16 at 12N. RN CM  Contacted HeartCare office and confirmed appt for 12/1 had been cancelled due to office staff mtg and patient has appt for 08/10/16. Patient aware and voices that he will keep appt. He also states he scheduled his PCP f/u appt for 08/07/16 at 9:15am. Patient wrote all appt info down and read back info to RN CM with accuracy. Advised patient that RN CM would f/u with him next week after appts. He voiced understanding.   Plan: RN CM will make outreach attempt to patient within a week.   [name], RN,BSN,CCM [place] Management Telephonic Care Management Coordinator Direct Phone: [phone] Toll Free: [phone] Fax: [phone]

## 2016-08-10 ENCOUNTER — Ambulatory Visit (INDEPENDENT_AMBULATORY_CARE_PROVIDER_SITE_OTHER): Payer: Medicare Other | Admitting: *Deleted

## 2016-08-10 ENCOUNTER — Ambulatory Visit: Payer: Self-pay

## 2016-08-10 DIAGNOSIS — Z950 Presence of cardiac pacemaker: Secondary | ICD-10-CM | POA: Diagnosis not present

## 2016-08-11 ENCOUNTER — Other Ambulatory Visit: Payer: Self-pay

## 2016-08-11 LAB — CUP PACEART INCLINIC DEVICE CHECK
Date Time Interrogation Session: 20171204050000
Implantable Lead Implant Date: 20171116
Implantable Lead Implant Date: 20171116
Implantable Lead Location: 753859
Implantable Lead Serial Number: 821967
Lead Channel Impedance Value: 769 Ohm
Lead Channel Sensing Intrinsic Amplitude: 12.9 mV
Lead Channel Setting Pacing Pulse Width: 0.4 ms
Lead Channel Setting Sensing Sensitivity: 2.5 mV
MDC IDC LEAD LOCATION: 753860
MDC IDC LEAD SERIAL: 743314
MDC IDC MSMT LEADCHNL RA IMPEDANCE VALUE: 804 Ohm
MDC IDC MSMT LEADCHNL RA SENSING INTR AMPL: 4 mV
MDC IDC PG IMPLANT DT: 20171116
MDC IDC PG SERIAL: 770005
MDC IDC SET LEADCHNL RA PACING AMPLITUDE: 3.5 V
MDC IDC SET LEADCHNL RV PACING AMPLITUDE: 3.5 V

## 2016-08-11 NOTE — Patient Outreach (Signed)
[facility]) Care Management  08/11/2016  Nicholas Harris 15-Aug-1941 [id number]     EMMI-HF RED ON EMMI ALERT Day # 10 Date: 08/07/16 Red Alert Reason: "Smoker? Yes"   Outreach call to patient. No answer at present. RN CM left HIPAA compliant voicemail message along with contact info.    Plan: RN CM will make outreach attempt to patient within three business days in no return call from patient.   [name], RN,BSN,CCM [place] Management Telephonic Care Management Coordinator Direct Phone: [phone] Toll Free: [phone] Fax: [phone]

## 2016-08-11 NOTE — Progress Notes (Signed)
Wound check appointment. Steri-strips removed. Wound without redness or edema. Incision edges approximated, wound well healed. Normal device function. Thresholds, sensing, and impedances consistent with implant measurements. Device programmed at 3.5V on for extra safety margin until 3 month visit. Histogram distribution appropriate for patient and level of activity. 4 mode switches (12%) EGMs show ATach + coumadin. No high ventricular rates noted. Patient educated about wound care, arm mobility, lifting restrictions and home monitoring. ROV with GT 2/16

## 2016-08-12 ENCOUNTER — Other Ambulatory Visit: Payer: Self-pay

## 2016-08-12 DIAGNOSIS — Z09 Encounter for follow-up examination after completed treatment for conditions other than malignant neoplasm: Secondary | ICD-10-CM | POA: Diagnosis not present

## 2016-08-12 NOTE — Patient Outreach (Signed)
[facility]) Care Management  08/12/2016  BRAYDON WISHAM 1941-02-06 [id number]   EMMI-HF RED ON EMMI ALERT Day # 10 Date: 08/07/16 Red Alert Reason: "Smoker? Yes"   Outreach attempt to patient. Spoke with patient. Addressed red alert. Patient denies being a smoker. Confirmed with patient that he did complete MD f/u appt on 08/10/16. He states that he also has an appt later today. Patient able to drive self to appts. Denies any issues with meds. Advised patient that he will continue to get EMMI HF automated calls and will receive call from RN CM if his responses are abnormal. He voiced understanding and was appreciative of call.   Plan: RN CM will make outreach call to patient within a week.  [name], RN,BSN,CCM [place] Management Telephonic Care Management Coordinator Direct Phone: [phone] Toll Free: [phone] Fax: [phone]

## 2016-08-13 ENCOUNTER — Ambulatory Visit (HOSPITAL_COMMUNITY)
Admission: RE | Admit: 2016-08-13 | Discharge: 2016-08-13 | Disposition: A | Discharge status: Home / Self Care | Payer: Medicare Other | Source: Ambulatory Visit | Attending: Internal Medicine | Admitting: Internal Medicine

## 2016-08-13 VITALS — BP 130/70 | HR 70 | Wt 246.2 lb

## 2016-08-13 DIAGNOSIS — I480 Paroxysmal atrial fibrillation: Secondary | ICD-10-CM | POA: Insufficient documentation

## 2016-08-13 DIAGNOSIS — E785 Hyperlipidemia, unspecified: Secondary | ICD-10-CM | POA: Diagnosis not present

## 2016-08-13 DIAGNOSIS — Z950 Presence of cardiac pacemaker: Secondary | ICD-10-CM | POA: Diagnosis not present

## 2016-08-13 DIAGNOSIS — E119 Type 2 diabetes mellitus without complications: Secondary | ICD-10-CM | POA: Insufficient documentation

## 2016-08-13 DIAGNOSIS — Z7984 Long term (current) use of oral hypoglycemic drugs: Secondary | ICD-10-CM | POA: Insufficient documentation

## 2016-08-13 DIAGNOSIS — Z7901 Long term (current) use of anticoagulants: Secondary | ICD-10-CM | POA: Diagnosis not present

## 2016-08-13 DIAGNOSIS — R001 Bradycardia, unspecified: Secondary | ICD-10-CM | POA: Diagnosis not present

## 2016-08-13 DIAGNOSIS — N400 Enlarged prostate without lower urinary tract symptoms: Secondary | ICD-10-CM | POA: Diagnosis not present

## 2016-08-13 DIAGNOSIS — I5032 Chronic diastolic (congestive) heart failure: Secondary | ICD-10-CM | POA: Diagnosis not present

## 2016-08-13 DIAGNOSIS — I110 Hypertensive heart disease with heart failure: Secondary | ICD-10-CM | POA: Insufficient documentation

## 2016-08-13 DIAGNOSIS — Z79899 Other long term (current) drug therapy: Secondary | ICD-10-CM | POA: Diagnosis not present

## 2016-08-13 DIAGNOSIS — Z87891 Personal history of nicotine dependence: Secondary | ICD-10-CM | POA: Diagnosis not present

## 2016-08-13 NOTE — Patient Instructions (Signed)
No changes to medication.  No lab work today.  Follow up 3 months with Amy Clegg NP-C. We will call you closer to this time, or you may call our office to schedule 1 month before you are due to be seen.  Merry Christmas and Happy New Year!  Do the following things EVERYDAY: 1) Weigh yourself in the morning before breakfast. Write it down and keep it in a log. 2) Take your medicines as prescribed 3) Eat low salt foods-Limit salt (sodium) to 2000 mg per day.  4) Stay as active as you can everyday 5) Limit all fluids for the day to less than 2 liters

## 2016-08-13 NOTE — Progress Notes (Signed)
Advanced Heart Failure Medication Review by a Pharmacist  Does the patient  feel that his/her medications are working for him/her?  yes  Has the patient been experiencing any side effects to the medications prescribed?  no  Does the patient measure his/her own blood pressure or blood glucose at home?  yes   Does the patient have any problems obtaining medications due to transportation or finances?   no  Understanding of regimen: good Understanding of indications: good Potential of compliance: good Patient understands to avoid NSAIDs. Patient understands to avoid decongestants.  Issues to address at subsequent visits: None   Pharmacist comments:  Nicholas Harris is a pleasant 75 yo M presenting with a medication list. He reports good compliance with his regimen and did not have any specific medication-related questions or concerns for me at this time.   Ruta Hinds. Velva Harman, PharmD, BCPS, CPP Clinical Pharmacist Pager: [phone] Phone: [phone] 08/13/2016 11:27 AM      Time with patient: 10 minutes Preparation and documentation time: 2 minutes Total time: 12 minutes

## 2016-08-13 NOTE — Progress Notes (Signed)
PCP: Dr Ashby Dawes Primary HF Cardiologist: Dr Aundra Dubin   EP  HPI: Nicholas Harris is a 75 y.o. male with history of HTN, DM2, hyperlipidemia, BPH and atrial fibrillation (determined paroxysmal by end of hospital stay).   Admitted 11/14 through 11/18 with increased dyspnea. He was found to have junctional bradycardia 45-50bpm, occasional dips into 30s as well as CHF. His troponin was elevated to 3.05, initially unclear if NSTEMI or demand ischemia related to acute heart failure. He was admitted for further evaluation and started on IV Lasix for diuresis, episodically requiring augmentation with metolazone. His amlodipine was stopped on admission. Coumadin was held with heparin crossover in prep for cath.  2D Echo 07/23/16 showing mild LVH, EF 65-70%, mild MR, mod LAE, mod dilated RV with moderately reduced RV function, mod-severe TR. He underwent [facility] 07/23/16 elevated R >L heart filling pressures with preserved cardiac output, no obstructive CAD. He was seen by EP this admission who felt he would benefit from permanent pacemaker - on 07/23/16 underwent [facility] (serial number [id number]) pacemaker implantation. His coumadin was restarted without heparin bridge given fresh implantation of device. With aggressive diuresis his weight went from 288lb on admission to 247lb on day of discharge.  Today he returns for post hospital follow up. Denies SOB/PND/Orthopnea. Denies presyncope and/syncopeWeight at home 240-242 pounds. Taking all medications. Lives alone. 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   Echo 07/23/16 LVEF 65-70%, Mild MR, Mod LAE, RV moderately dilated/reduced, Severe RAE, Mod/Sev TR.  R/LHC 07/23/16 1. Angiography - No obstructive CAD. 2. Hemodynamics (mmHg) RA mean 19 with cannon a waves to 27 RV 47/16 PA 52/15, mean 27 PCWP mean 22 with prominent v-waves LV 123/18 AO 124/59 Oxygen saturations: PA 60% AO 97% Cardiac Output (Fick) 5.74  Cardiac Index (Fick)  2.38  ROS: All systems negative except as listed in HPI, PMH and Problem List.  SH:  Social History   Social History  . Marital status: Divorced    Spouse name: N/A  . Number of children: N/A  . Years of education: N/A   Occupational History  . Not on file.   Social History Main Topics  . Smoking status: Former Research scientist (life sciences)  . Smokeless tobacco: Never Used  . Alcohol use No     Comment: 2-3/wk  . Drug use: Unknown  . Sexual activity: Not on file   Other Topics Concern  . Not on file   Social History Narrative  . No narrative on file    FH: No family history on file.  Past Medical History:  Diagnosis Date  . BPH (benign prostatic hyperplasia)   . Chronic diastolic CHF (congestive heart failure) ([place])   . Demand ischemia ([place])    a. elev trop 07/2016 in setting of demand ischemia from CHF/bradycardia - no sig CAD by cath at that time.  . Diabetes mellitus without complication ([place])   . High cholesterol   . Hypertension   . Junctional bradycardia    a. s/p Boston Sci ppm 07/2016.  . Mitral regurgitation   . PAF (paroxysmal atrial fibrillation) ([place])   . Renal insufficiency   . Right ventricular dysfunction   . S/P cardiac pacemaker procedure   . Tricuspid regurgitation     Current Outpatient Prescriptions  Medication Sig Dispense Refill  . Ascorbic Acid (VITAMIN C) 1000 MG tablet Take 1,000 mg by mouth daily.    Marland Kitchen atorvastatin (LIPITOR) 20 MG tablet Take 20 mg by mouth at bedtime.  0  .  finasteride (PROSCAR) 5 MG tablet Take 5 mg by mouth daily with breakfast.     . furosemide (LASIX) 40 MG tablet Take 1 tablet (40 mg total) by mouth daily. 30 tablet 3  . glimepiride (AMARYL) 4 MG tablet Take 4 mg by mouth daily with breakfast.    . guaiFENesin (MUCINEX) 600 MG 12 hr tablet Take 600 mg by mouth 2 (two) times daily.    . Investigational - Study Medication A randomized, double-blind, placebo-controlled, parallel-group, multicenter study to evaluate the efficacy and  safety of sotagliflozin added to a sulfonylurea alone or in combination with metformin in patients with type 2 diabetes who have inadequate glycemic control on a sulfonylurea alone or with metformin    . lisinopril (PRINIVIL,ZESTRIL) 20 MG tablet Take 20 mg by mouth daily with breakfast.     . metFORMIN (GLUCOPHAGE-XR) 500 MG 24 hr tablet Take 1,000 mg by mouth 2 (two) times daily.  0  . tamsulosin (FLOMAX) 0.4 MG CAPS capsule Take 0.8 mg by mouth daily after breakfast.     . warfarin (COUMADIN) 5 MG tablet Take 2.5-5 mg by mouth daily. Takes 5mg  everyday except Sunday takes 2.5mg      No current facility-administered medications for this encounter.     Vitals:   08/13/16 1116  BP: 130/70  Pulse: 70  SpO2: 97%  Weight: 246 lb 3.2 oz (111.7 kg)    PHYSICAL EXAM: General:  Well appearing. No resp difficulty. Ambulated in the clinic without difficulty.  HEENT: normal Neck: supple. JVP flat. Carotids 2+ bilaterally; no bruits. No lymphadenopathy or thryomegaly appreciated. Cor: PMI normal. Regular rate & rhythm. No rubs, gallops or murmurs. L upper chest PPM  Lungs: clear Abdomen: soft, nontender, nondistended. No hepatosplenomegaly. No bruits or masses. Good bowel sounds. Extremities: no cyanosis, clubbing, rash, edema Neuro: alert & orientedx3, cranial nerves grossly intact. Moves all 4 extremities w/o difficulty. Affect pleasant.   ASSESSMENT & PLAN: 1. Chronic Diastolic CHF:- Echo [id number] LVEF 65-70%, Mild MR, Mod LAE, RV moderately dilated/reduced, Severe RAE, Mod/Sev TR. Normal cors on cath 07/2016.  - Volume status stable. Continue lasix 40 mg daily. - Continue lisinopril 20 mg daily.   2.  Atrial fibrillation: Paroxysmal versus persistent. Has been known for years, on warfarin at home. No nodal blockade among home meds. CHADSVASC = 5.  - Now s/p PPM.  - No nodal blockers. On coumadin. INR. Managed  3. Junctional bradycardia:  - s/p Boston Sci PPM placement 07/23/16 by Dr.  Lovena Le.  4. Diabetes:- Per PCP  Follow up in 3 months with Dr Aundra Dubin.   Amy Clegg NP-C  1:59 PM

## 2016-08-14 ENCOUNTER — Other Ambulatory Visit: Payer: Self-pay

## 2016-08-14 LAB — GLUCOSE, POCT (MANUAL RESULT ENTRY): POC Glucose: 165 mg/dl — AB (ref 70–99)

## 2016-08-14 NOTE — Patient Outreach (Signed)
[facility]) Care Management  08/14/2016  RALF MATESIC [birth year] [id number]     EMMI-HF RED ON EMMI ALERT Day # 16 Date: 08/13/16 Red Alert Reason: "Weighed themselves today? No"   Outreach attempt to patient. No answer at present.     Plan: RN CM will make outreach attempt to patient within three business days.   [name], RN,BSN,CCM [place] Management Telephonic Care Management Coordinator Direct Phone: [phone] Toll Free: [phone] Fax: [phone]

## 2016-08-17 ENCOUNTER — Other Ambulatory Visit: Payer: Self-pay

## 2016-08-17 NOTE — Patient Outreach (Signed)
[place] [facility]) Care Management  08/17/2016  Nicholas Harris 08-18-1941 [id number]   EMMI-HF RED ON EMMI ALERT Day # 16 Date: 08/13/16 Red Alert Reason: "Weighed themselves today? No"    Outreach attempt # 2 to patient. Spoke with patient and addressed red alert. Patient states that he is weighing daily at home. Weight reported as stable. He states that automated calls come before he has had a chance to weigh most mornings which triggered his response no. He denies any issues with edema or fluid retention at this time. Discussed s/s of worsening condition. Patient advised to contact RN CM for any needs or concerns.    Plan: RN CM will make outreach call to patient within a week.

## 2016-08-19 ENCOUNTER — Ambulatory Visit: Payer: Self-pay

## 2016-08-20 ENCOUNTER — Telehealth (HOSPITAL_COMMUNITY): Payer: Self-pay | Admitting: *Deleted

## 2016-08-20 ENCOUNTER — Telehealth (HOSPITAL_COMMUNITY): Payer: Self-pay | Admitting: Vascular Surgery

## 2016-08-20 NOTE — Telephone Encounter (Signed)
OK to return to work 

## 2016-08-20 NOTE — Telephone Encounter (Signed)
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  Will send to Dr Aundra Dubin for review

## 2016-08-21 NOTE — Telephone Encounter (Signed)
Encounter open in error 

## 2016-08-21 NOTE — Telephone Encounter (Signed)
Left message to call back  

## 2016-08-24 ENCOUNTER — Other Ambulatory Visit: Payer: Self-pay

## 2016-08-24 ENCOUNTER — Encounter (HOSPITAL_COMMUNITY): Payer: Self-pay | Admitting: *Deleted

## 2016-08-24 NOTE — Patient Outreach (Signed)
[place] [facility]) Care Management  08/24/2016  Nicholas Harris 1941/04/10 [id number]     EMMI-HF F/U Call   Outreach call to patient. Patient reported he is doing well. Denies any new issues or concerns. He voices that weight is staying stable. No edema or SOB noted/reported. Patient states he is going to MD office today to see if he can be cleared to return to work. 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 He feels that he is able to return to work as his job is not strenuous of taxing on him. No RN CM needs at this time. Patient continues to get automated EMMI HF calls. Advised to call RN CM for any needs. Patient verbalizes knowing when and who to contact MD for changes in condition.     Plan: RN CM will make outreach call to patient within 10 business days.    [name], RN,BSN,CCM [place] Management Telephonic Care Management Coordinator Direct Phone: [phone] Toll Free: [phone] Fax: [phone]

## 2016-08-24 NOTE — Telephone Encounter (Signed)
Patient called in asking for a letter stating he can return to work with Dr. Claris Gladden approval.  Patient agreed to pick letter up at our front desk this afternoon.

## 2016-08-25 ENCOUNTER — Other Ambulatory Visit: Payer: Self-pay

## 2016-08-25 NOTE — Patient Outreach (Signed)
[facility]) Care Management  08/25/2016  Nicholas Harris 1941/05/29 [id number]     EMMI- HF RED ON EMMI ALERT Day # 27 Date: 08/24/16 Red Alert Reason: 'Lost interest in things they used to enjoy? Yes'   Red alert issues addressed with previous call. Patient is desiring to return back to home and do some of his normal activities prior to his hospitalization. He states that he has not been able to be active and do a lot of things and hopes that changes. He plans to discuss with MD returning back to work.    Plan: RN CM will contact patient within 10 business days.   Nicholas Montgomery, RN,BSN,CCM [place] Management Telephonic Care Management Coordinator Direct Phone: [phone] Toll Free: [phone] Fax: [phone]

## 2016-08-25 NOTE — Patient Outreach (Signed)
Patient triggered Red on EMMI heart failure dashboard, notification sent to [name], RN

## 2016-09-02 ENCOUNTER — Other Ambulatory Visit: Payer: Self-pay

## 2016-09-02 NOTE — Patient Outreach (Signed)
[facility]) Care Management  09/02/2016  Nicholas Harris [birth year] [id number]     EMMI-HF Follow Up Call   Outreach call to patient. Spoke with patient who was pleased to report that he was doing well and had a wonderful holiday. He denies any new issues or concerns. Denies any edema or SOB. He reports that his pacer is doing fine and has had no issues with it. Confirmed with patient his upcoming appts in jan and Feb. He is able to drive himself. No issues with meds as patient able to manage on his own. Advised patient that RN CM was closing out his case as he has progressed and is doing well since last hospitalization. Patient verbalized understanding and was appreciative of RN CM calls and assistance.    Plan: RN CM will notify [facility] administrative assistant of case closure status.   [name], RN,BSN,CCM [place] Management Telephonic Care Management Coordinator Direct Phone: [phone] Toll Free: [phone] Fax: [phone]

## 2016-09-17 DIAGNOSIS — E1165 Type 2 diabetes mellitus with hyperglycemia: Secondary | ICD-10-CM | POA: Diagnosis not present

## 2016-09-17 DIAGNOSIS — E782 Mixed hyperlipidemia: Secondary | ICD-10-CM | POA: Diagnosis not present

## 2016-09-21 DIAGNOSIS — E1165 Type 2 diabetes mellitus with hyperglycemia: Secondary | ICD-10-CM | POA: Diagnosis not present

## 2016-09-21 DIAGNOSIS — R001 Bradycardia, unspecified: Secondary | ICD-10-CM | POA: Diagnosis not present

## 2016-09-21 DIAGNOSIS — E782 Mixed hyperlipidemia: Secondary | ICD-10-CM | POA: Diagnosis not present

## 2016-09-21 DIAGNOSIS — E1121 Type 2 diabetes mellitus with diabetic nephropathy: Secondary | ICD-10-CM | POA: Diagnosis not present

## 2016-10-16 ENCOUNTER — Encounter: Payer: Self-pay | Admitting: Internal Medicine

## 2016-10-21 ENCOUNTER — Encounter: Payer: Self-pay | Admitting: Internal Medicine

## 2016-10-21 ENCOUNTER — Encounter (INDEPENDENT_AMBULATORY_CARE_PROVIDER_SITE_OTHER): Payer: Self-pay

## 2016-10-21 ENCOUNTER — Telehealth: Payer: Self-pay | Admitting: Pharmacist

## 2016-10-21 ENCOUNTER — Ambulatory Visit (INDEPENDENT_AMBULATORY_CARE_PROVIDER_SITE_OTHER): Payer: Medicare Other | Admitting: Internal Medicine

## 2016-10-21 VITALS — BP 110/56 | HR 78 | Ht 69.0 in | Wt 250.2 lb

## 2016-10-21 DIAGNOSIS — Z950 Presence of cardiac pacemaker: Secondary | ICD-10-CM | POA: Diagnosis not present

## 2016-10-21 DIAGNOSIS — I5032 Chronic diastolic (congestive) heart failure: Secondary | ICD-10-CM | POA: Diagnosis not present

## 2016-10-21 DIAGNOSIS — I480 Paroxysmal atrial fibrillation: Secondary | ICD-10-CM

## 2016-10-21 LAB — CUP PACEART INCLINIC DEVICE CHECK
Date Time Interrogation Session: 20180214050000
Implantable Lead Implant Date: 20171116
Implantable Lead Location: 753860
Implantable Lead Model: 7741
Implantable Lead Serial Number: 743314
Implantable Lead Serial Number: 821967
Lead Channel Impedance Value: 717 Ohm
Lead Channel Sensing Intrinsic Amplitude: 3.4 mV
MDC IDC LEAD IMPLANT DT: 20171116
MDC IDC LEAD LOCATION: 753859
MDC IDC MSMT LEADCHNL RA IMPEDANCE VALUE: 757 Ohm
MDC IDC MSMT LEADCHNL RV SENSING INTR AMPL: 12 mV
MDC IDC PG IMPLANT DT: 20171116
MDC IDC PG SERIAL: 770005
MDC IDC SET LEADCHNL RA PACING AMPLITUDE: 2 V
MDC IDC SET LEADCHNL RV PACING AMPLITUDE: 2.5 V
MDC IDC SET LEADCHNL RV PACING PULSEWIDTH: 0.4 ms
MDC IDC SET LEADCHNL RV SENSING SENSITIVITY: 2.5 mV

## 2016-10-21 NOTE — Patient Instructions (Addendum)
Medication Instructions:  Your physician recommends that you continue on your current medications as directed. Please refer to the Current Medication list given to you today.   Labwork: TODAY - INR in [facility] and in INR 2 weeks   Testing/Procedures: None Ordered   Follow-Up: Your physician recommends that you schedule a follow-up appointment in: 1 month with Dr. Lovena Le.    Any Other Special Instructions Will Be Listed Below (If Applicable).     If you need a refill on your cardiac medications before your next appointment, please call your pharmacy.

## 2016-10-21 NOTE — Progress Notes (Signed)
HPI [name] returns today for followup. He is a pleasant 76 yo man with CHB, sinus node dysfunction who underwent PPM insertion months ago. In the interim, he has done well. He is sedentary but denies chest pain. He has minimal dyspnea. No syncope and no edema. He has not had his INR checked.  No Known Allergies   Current Outpatient Prescriptions  Medication Sig Dispense Refill  . Ascorbic Acid (VITAMIN C) 1000 MG tablet Take 1,000 mg by mouth daily.    Marland Kitchen atorvastatin (LIPITOR) 20 MG tablet Take 20 mg by mouth at bedtime.  0  . finasteride (PROSCAR) 5 MG tablet Take 5 mg by mouth daily with breakfast.     . furosemide (LASIX) 40 MG tablet Take 1 tablet (40 mg total) by mouth daily. 30 tablet 3  . glimepiride (AMARYL) 4 MG tablet Take 4 mg by mouth daily with breakfast.    . guaiFENesin (MUCINEX) 600 MG 12 hr tablet Take 600 mg by mouth 2 (two) times daily as needed for cough or to loosen phlegm.     Marland Kitchen lisinopril (PRINIVIL,ZESTRIL) 20 MG tablet Take 20 mg by mouth daily with breakfast.     . metFORMIN (GLUCOPHAGE-XR) 500 MG 24 hr tablet Take 1,000 mg by mouth 2 (two) times daily.  0  . tamsulosin (FLOMAX) 0.4 MG CAPS capsule Take 0.8 mg by mouth daily after breakfast.     . warfarin (COUMADIN) 5 MG tablet Take 2.5-5 mg by mouth daily. Takes 5mg  everyday except Sunday takes 2.5mg      No current facility-administered medications for this visit.      Past Medical History:  Diagnosis Date  . BPH (benign prostatic hyperplasia)   . Chronic diastolic CHF (congestive heart failure) ([place])   . Demand ischemia ([place])    a. elev trop 07/2016 in setting of demand ischemia from CHF/bradycardia - no sig CAD by cath at that time.  . Diabetes mellitus without complication ([place])   . High cholesterol   . Hypertension   . Junctional bradycardia    a. s/p Boston Sci ppm 07/2016.  . Mitral regurgitation   . PAF (paroxysmal atrial fibrillation) ([place])   . Renal insufficiency   . Right  ventricular dysfunction   . S/P cardiac pacemaker procedure   . Tricuspid regurgitation     ROS:   All systems reviewed and negative except as noted in the HPI.   Past Surgical History:  Procedure Laterality Date  . CARDIAC CATHETERIZATION N/A 07/23/2016   Procedure: Right/Left Heart Cath and Coronary Angiography;  Surgeon: Larey Dresser, MD;  Location: [place] [facility];  Service: Cardiovascular;  Laterality: N/A;  . EP IMPLANTABLE DEVICE N/A 07/23/2016   Procedure: Pacemaker Implant;  Surgeon: Evans Lance, MD;  Location: [place] [facility];  Service: Cardiovascular;  Laterality: N/A;     History reviewed. No pertinent family history.   Social History   Social History  . Marital status: Divorced    Spouse name: N/A  . Number of children: N/A  . Years of education: N/A   Occupational History  . Not on file.   Social History Main Topics  . Smoking status: Former Research scientist (life sciences)  . Smokeless tobacco: Never Used  . Alcohol use No     Comment: 2-3/wk  . Drug use: Unknown  . Sexual activity: Not on file   Other Topics Concern  . Not on file   Social History Narrative  . No narrative on  file     BP (!) 110/56   Pulse 78   Ht 5\' 9"  (1.753 m)   Wt 250 lb 3.2 oz (113.5 kg)   SpO2 98%   BMI 36.95 kg/m   Physical Exam:  Well appearing NAD HEENT: Unremarkable Neck:  No JVD, no thyromegally Lymphatics:  No adenopathy Back:  No CVA tenderness Lungs:  Clear with no wheezes HEART:  Regular rate rhythm, no murmurs, no rubs, no clicks Abd:  soft, positive bowel sounds, no organomegally, no rebound, no guarding Ext:  2 plus pulses, no edema, no cyanosis, no clubbing Skin:  No rashes no nodules Neuro:  CN II through XII intact, motor grossly intact  EKG - atrial flutter with ventricular pacing.  DEVICE  Normal device function.  See PaceArt for details.   Assess/Plan: 1. CHB - he is s/p PPM insertion.  2. Sinus node dysfunction - he is asymptomatic. 3. PPM - his  [employer] device is working normally. He will be recheck in several months. 4. Atrial flutter - I went back and looked at his ECG's. He was in NSR/sinus brady. I have recommended he consider flutter ablation if he remains in atrial flutter alone.  Mikle Bosworth.D.

## 2016-10-21 NOTE — Telephone Encounter (Signed)
Patient in office seeing Dr. Lovena Le - INR checked - 1.7. Patient reports that INR has been managed by [facility] (Dr. Ashby Dawes) and has been for several years. He states he would like to continue management with their office.   Pt reports he missed a dose. Advised to take 7.5mg  (an extra 1/2 tablet) today then back to his regular dose (5mg  daily except 2.5mg  each Sunday) and follow up with PCP in 2 weeks for recheck.   Information faxed to PCP as FYI and for when to recheck per Dr. Lovena Le.

## 2016-10-23 ENCOUNTER — Encounter: Payer: Medicare Other | Admitting: Internal Medicine

## 2016-11-17 ENCOUNTER — Encounter: Payer: Self-pay | Admitting: Internal Medicine

## 2016-11-24 ENCOUNTER — Encounter: Payer: Self-pay | Admitting: Internal Medicine

## 2016-11-24 ENCOUNTER — Ambulatory Visit (INDEPENDENT_AMBULATORY_CARE_PROVIDER_SITE_OTHER): Payer: Medicare Other | Admitting: Internal Medicine

## 2016-11-24 DIAGNOSIS — Z7901 Long term (current) use of anticoagulants: Secondary | ICD-10-CM | POA: Diagnosis not present

## 2016-11-24 DIAGNOSIS — I482 Chronic atrial fibrillation: Secondary | ICD-10-CM | POA: Diagnosis not present

## 2016-11-24 DIAGNOSIS — I480 Paroxysmal atrial fibrillation: Secondary | ICD-10-CM

## 2016-11-24 NOTE — Progress Notes (Signed)
HPI Nicholas Harris returns today for followup. He is a pleasant 76 yo man with CHB, sinus node dysfunction who underwent PPM insertion months ago. In the interim, he has done well. He is sedentary but denies chest pain. He has minimal dyspnea. No syncope and no edema. He has not had his INR checked. He has been in atrial flutter for several months with a controlled VR. No Known Allergies   Current Outpatient Prescriptions  Medication Sig Dispense Refill  . Ascorbic Acid (VITAMIN C) 1000 MG tablet Take 1,000 mg by mouth daily.    Marland Kitchen atorvastatin (LIPITOR) 20 MG tablet Take 20 mg by mouth at bedtime.  0  . finasteride (PROSCAR) 5 MG tablet Take 5 mg by mouth daily with breakfast.     . furosemide (LASIX) 40 MG tablet Take 1 tablet (40 mg total) by mouth daily. 30 tablet 3  . glimepiride (AMARYL) 4 MG tablet Take 4 mg by mouth daily with breakfast.    . guaiFENesin (MUCINEX) 600 MG 12 hr tablet Take 600 mg by mouth 2 (two) times daily as needed for cough or to loosen phlegm.     Marland Kitchen lisinopril (PRINIVIL,ZESTRIL) 20 MG tablet Take 20 mg by mouth daily with breakfast.     . metFORMIN (GLUCOPHAGE-XR) 500 MG 24 hr tablet Take 1,000 mg by mouth 2 (two) times daily.  0  . tamsulosin (FLOMAX) 0.4 MG CAPS capsule Take 0.8 mg by mouth daily after breakfast.     . warfarin (COUMADIN) 5 MG tablet Take 2.5-5 mg by mouth daily. Takes 5mg  everyday except Sunday takes 2.5mg      No current facility-administered medications for this visit.      Past Medical History:  Diagnosis Date  . BPH (benign prostatic hyperplasia)   . Chronic diastolic CHF (congestive heart failure) ([place])   . Demand ischemia ([place])    a. elev trop 07/2016 in setting of demand ischemia from CHF/bradycardia - no sig CAD by cath at that time.  . Diabetes mellitus without complication ([place])   . High cholesterol   . Hypertension   . Junctional bradycardia    a. s/p Boston Sci ppm 07/2016.  . Mitral regurgitation   . PAF (paroxysmal  atrial fibrillation) ([place])   . Renal insufficiency   . Right ventricular dysfunction   . S/P cardiac pacemaker procedure   . Tricuspid regurgitation     ROS:   All systems reviewed and negative except as noted in the HPI.   Past Surgical History:  Procedure Laterality Date  . CARDIAC CATHETERIZATION N/A 07/23/2016   Procedure: Right/Left Heart Cath and Coronary Angiography;  Surgeon: Larey Dresser, MD;  Location: [place] [facility];  Service: Cardiovascular;  Laterality: N/A;  . EP IMPLANTABLE DEVICE N/A 07/23/2016   Procedure: Pacemaker Implant;  Surgeon: Evans Lance, MD;  Location: [place] [facility];  Service: Cardiovascular;  Laterality: N/A;     No family history on file.   Social History   Social History  . Marital status: Divorced    Spouse name: N/A  . Number of children: N/A  . Years of education: N/A   Occupational History  . Not on file.   Social History Main Topics  . Smoking status: Former Research scientist (life sciences)  . Smokeless tobacco: Never Used  . Alcohol use No     Comment: 2-3/wk  . Drug use: No  . Sexual activity: Not on file   Other Topics Concern  . Not on  file   Social History Narrative  . No narrative on file     BP 110/68   Pulse 71   Ht 5\' 11"  (1.803 m)   Wt 247 lb 3.2 oz (112.1 kg)   SpO2 99%   BMI 34.48 kg/m   Physical Exam:  Well appearing 76 yo man, NAD HEENT: Unremarkable Neck:  7 cm JVD, no thyromegally Lymphatics:  No adenopathy Back:  No CVA tenderness Lungs:  Clear with no wheezes, minimal basilar rales HEART:  Regular rate rhythm, no murmurs, no rubs, no clicks Abd:  soft, positive bowel sounds, no organomegally, no rebound, no guarding Ext:  2 plus pulses, no edema, no cyanosis, no clubbing Skin:  No rashes no nodules Neuro:  CN II through XII intact, motor grossly intact  EKG - reviewed- atrial flutter with ventricular pacing.  DEVICE  Normal device function.  See PaceArt for details. Atrial flutter  Assess/Plan: 1.  CHB - he is s/p PPM insertion.  2. Sinus node dysfunction - he is asymptomatic. 3. PPM - his [employer] device is working normally. We will be recheck in several months. 4. Atrial flutter - I went back and looked at his ECG's. He was in NSR/sinus brady. I have recommended he consider flutter ablation if he remains in atrial flutter alone. I will see him in 3 months. He is currently working and would like to wait until school is out before considering an ablation.  Nicholas Harris.D.

## 2016-11-24 NOTE — Patient Instructions (Addendum)
Medication Instructions:  Your physician recommends that you continue on your current medications as directed. Please refer to the Current Medication list given to you today.   Labwork: None ordered  Testing/Procedures: None ordered  Follow-Up: Remote monitoring is used to monitor your Pacemaker from home. This monitoring reduces the number of office visits required to check your device to one time per year. It allows [place] to keep an eye on the functioning of your device to ensure it is working properly. You are scheduled for a device check from home on 02/23/17. You may send your transmission at any time that day. If you have a wireless device, the transmission will be sent automatically. After your physician reviews your transmission, you will receive a postcard with your next transmission date.  Your physician wants you to follow-up in: the end of May with Dr. Lovena Le to discuss catheter ablation. You will receive a reminder letter in the mail two months in advance. If you don't receive a letter, please call our office to schedule the follow-up appointment.   Any Other Special Instructions Will Be Listed Below (If Applicable).     If you need a refill on your cardiac medications before your next appointment, please call your pharmacy.

## 2016-11-27 LAB — CUP PACEART INCLINIC DEVICE CHECK
Battery Remaining Longevity: 150 mo
Brady Statistic RV Percent Paced: 68 %
Date Time Interrogation Session: 20180320040000
Implantable Lead Implant Date: 20171116
Implantable Lead Location: 753859
Implantable Lead Serial Number: 743314
Implantable Pulse Generator Implant Date: 20171116
Lead Channel Setting Pacing Amplitude: 2 V
Lead Channel Setting Pacing Pulse Width: 0.4 ms
Lead Channel Setting Sensing Sensitivity: 2.5 mV
MDC IDC LEAD IMPLANT DT: 20171116
MDC IDC LEAD LOCATION: 753860
MDC IDC LEAD SERIAL: 821967
MDC IDC MSMT LEADCHNL RA IMPEDANCE VALUE: 752 Ohm
MDC IDC MSMT LEADCHNL RA SENSING INTR AMPL: 3.6 mV
MDC IDC MSMT LEADCHNL RV IMPEDANCE VALUE: 713 Ohm
MDC IDC MSMT LEADCHNL RV SENSING INTR AMPL: 10.6 mV
MDC IDC SET LEADCHNL RV PACING AMPLITUDE: 2.5 V
Pulse Gen Serial Number: 770005

## 2017-01-06 ENCOUNTER — Other Ambulatory Visit: Payer: Self-pay | Admitting: Physician Assistant

## 2017-01-07 DIAGNOSIS — Z7901 Long term (current) use of anticoagulants: Secondary | ICD-10-CM | POA: Diagnosis not present

## 2017-01-07 DIAGNOSIS — I482 Chronic atrial fibrillation: Secondary | ICD-10-CM | POA: Diagnosis not present

## 2017-01-11 ENCOUNTER — Encounter: Payer: Self-pay | Admitting: Cardiology

## 2017-01-27 ENCOUNTER — Encounter (INDEPENDENT_AMBULATORY_CARE_PROVIDER_SITE_OTHER): Payer: Self-pay

## 2017-01-27 ENCOUNTER — Encounter: Payer: Self-pay | Admitting: Internal Medicine

## 2017-01-27 ENCOUNTER — Ambulatory Visit (INDEPENDENT_AMBULATORY_CARE_PROVIDER_SITE_OTHER): Payer: Medicare Other | Admitting: Internal Medicine

## 2017-01-27 VITALS — BP 122/66 | HR 70 | Ht 70.0 in | Wt 249.0 lb

## 2017-01-27 DIAGNOSIS — R001 Bradycardia, unspecified: Secondary | ICD-10-CM

## 2017-01-27 DIAGNOSIS — I5032 Chronic diastolic (congestive) heart failure: Secondary | ICD-10-CM | POA: Diagnosis not present

## 2017-01-27 DIAGNOSIS — Z950 Presence of cardiac pacemaker: Secondary | ICD-10-CM | POA: Diagnosis not present

## 2017-01-27 DIAGNOSIS — I480 Paroxysmal atrial fibrillation: Secondary | ICD-10-CM

## 2017-01-27 NOTE — Patient Instructions (Addendum)
Medication Instructions:  Your physician recommends that you continue on your current medications as directed. Please refer to the Current Medication list given to you today.   Labwork: BMET / CBC - 1-2 weeks prior   Testing/Procedures:  Your physician has recommended that you have an ablation. Catheter ablation is a medical procedure used to treat some cardiac arrhythmias (irregular heartbeats). During catheter ablation, a long, thin, flexible tube is put into a blood vessel in your groin (upper thigh), or neck. This tube is called an ablation catheter. It is then guided to your heart through the blood vessel. Radio frequency waves destroy small areas of heart tissue where abnormal heartbeats may cause an arrhythmia to start. Please see the instruction sheet given to you today.--will need carto  Possible Dates: 6/13, 6/18, 6/27,  7/6, 7/11, 7/18, 7/19, 7/23 Please call our office to schedule - [phone]    Follow-Up: 4-6 weeks with Dr. Lovena Le after ablation        If you need a refill on your cardiac medications before your next appointment, please call your pharmacy.

## 2017-01-29 NOTE — Progress Notes (Signed)
HPI Mr. Sulkowski returns today for ongoing evaluation and management of atrial flutter. He is a 76 yo man with CHB, sinus node dysfunction who underwent PPM insertion months ago in November 2017. In the interim, he has developed atrial flutter. He is sedentary but denies chest pain. He has dyspnea which is ok at rest but worsens with activity, particularly since he has been in atrial flutter.He had not had his INR's monitored when I saw him last.  No Known Allergies   Current Outpatient Prescriptions  Medication Sig Dispense Refill  . Ascorbic Acid (VITAMIN C) 1000 MG tablet Take 1,000 mg by mouth daily.    Marland Kitchen atorvastatin (LIPITOR) 20 MG tablet Take 20 mg by mouth at bedtime.  0  . finasteride (PROSCAR) 5 MG tablet Take 5 mg by mouth daily with breakfast.     . furosemide (LASIX) 40 MG tablet take 1 tablet by mouth once daily 30 tablet 9  . glimepiride (AMARYL) 4 MG tablet Take 4 mg by mouth daily with breakfast.    . guaiFENesin (MUCINEX) 600 MG 12 hr tablet Take 600 mg by mouth 2 (two) times daily as needed for cough or to loosen phlegm.     Marland Kitchen lisinopril (PRINIVIL,ZESTRIL) 20 MG tablet Take 20 mg by mouth daily with breakfast.     . metFORMIN (GLUCOPHAGE-XR) 500 MG 24 hr tablet Take 1,000 mg by mouth 2 (two) times daily.  0  . tamsulosin (FLOMAX) 0.4 MG CAPS capsule Take 0.8 mg by mouth daily after breakfast.     . warfarin (COUMADIN) 5 MG tablet Take 2.5-5 mg by mouth daily. Takes 5mg  everyday except Sunday takes 2.5mg      No current facility-administered medications for this visit.      Past Medical History:  Diagnosis Date  . BPH (benign prostatic hyperplasia)   . Chronic diastolic CHF (congestive heart failure) ([place])   . Demand ischemia ([place])    a. elev trop 07/2016 in setting of demand ischemia from CHF/bradycardia - no sig CAD by cath at that time.  . Diabetes mellitus without complication ([place])   . High cholesterol   . Hypertension   . Junctional bradycardia    a.  s/p Boston Sci ppm 07/2016.  . Mitral regurgitation   . PAF (paroxysmal atrial fibrillation) ([place])   . Renal insufficiency   . Right ventricular dysfunction   . S/P cardiac pacemaker procedure   . Tricuspid regurgitation     ROS:   All systems reviewed and negative except as noted in the HPI.   Past Surgical History:  Procedure Laterality Date  . CARDIAC CATHETERIZATION N/A 07/23/2016   Procedure: Right/Left Heart Cath and Coronary Angiography;  Surgeon: Larey Dresser, MD;  Location: [place] [facility];  Service: Cardiovascular;  Laterality: N/A;  . EP IMPLANTABLE DEVICE N/A 07/23/2016   Procedure: Pacemaker Implant;  Surgeon: Evans Lance, MD;  Location: [place] [facility];  Service: Cardiovascular;  Laterality: N/A;     History reviewed. No pertinent family history.   Social History   Social History  . Marital status: Divorced    Spouse name: N/A  . Number of children: N/A  . Years of education: N/A   Occupational History  . Not on file.   Social History Main Topics  . Smoking status: Former Research scientist (life sciences)  . Smokeless tobacco: Never Used  . Alcohol use No     Comment: 2-3/wk  . Drug use: No  . Sexual activity: Not  on file   Other Topics Concern  . Not on file   Social History Narrative  . No narrative on file     BP 122/66   Pulse 70   Ht 5\' 10"  (1.778 m)   Wt 249 lb (112.9 kg)   SpO2 97%   BMI 35.73 kg/m   Physical Exam:  Well appearing 76 yo man, NAD HEENT: Unremarkable Neck:  6 cm JVD, no thyromegally Lymphatics:  No adenopathy Back:  No CVA tenderness Lungs:  Clear with no wheezes, minimal basilar rales HEART:  Regular rate rhythm, no murmurs, no rubs, no clicks Abd:  soft, positive bowel sounds, no organomegally, no rebound, no guarding Ext:  2 plus pulses, no edema, no cyanosis, no clubbing Skin:  No rashes no nodules Neuro:  CN II through XII intact, motor grossly intact  EKG - reviewed- atrial flutter with ventricular pacing.  DEVICE   Normal [place] DDD PM function.  See PaceArt for details. Atrial flutter  Assess/Plan: 1. CHB - he is s/p PPM insertion.  2. Sinus node dysfunction - he is asymptomatic. 3. PPM - his [employer] device is working normally. We will recheck in several months. 4. Atrial flutter - I went back and looked at his ECG's. He remains in atrial flutter over the past 3 months and I have recommended her undergo EP study and catheter ablation. The risks/benefits/goals/expectations have been discussed and he is considering his options and will call us if he wishes to undergo ablation.   Mikle Bosworth.D.

## 2017-02-15 ENCOUNTER — Encounter: Payer: Medicare Other | Admitting: Internal Medicine

## 2017-02-24 DIAGNOSIS — E782 Mixed hyperlipidemia: Secondary | ICD-10-CM | POA: Diagnosis not present

## 2017-02-24 DIAGNOSIS — N390 Urinary tract infection, site not specified: Secondary | ICD-10-CM | POA: Diagnosis not present

## 2017-02-24 DIAGNOSIS — E1121 Type 2 diabetes mellitus with diabetic nephropathy: Secondary | ICD-10-CM | POA: Diagnosis not present

## 2017-02-24 DIAGNOSIS — E1165 Type 2 diabetes mellitus with hyperglycemia: Secondary | ICD-10-CM | POA: Diagnosis not present

## 2017-02-24 DIAGNOSIS — I10 Essential (primary) hypertension: Secondary | ICD-10-CM | POA: Diagnosis not present

## 2017-02-25 DIAGNOSIS — Z0000 Encounter for general adult medical examination without abnormal findings: Secondary | ICD-10-CM | POA: Diagnosis not present

## 2017-03-04 DIAGNOSIS — E1165 Type 2 diabetes mellitus with hyperglycemia: Secondary | ICD-10-CM | POA: Diagnosis not present

## 2017-03-04 DIAGNOSIS — E782 Mixed hyperlipidemia: Secondary | ICD-10-CM | POA: Diagnosis not present

## 2017-03-04 DIAGNOSIS — E1121 Type 2 diabetes mellitus with diabetic nephropathy: Secondary | ICD-10-CM | POA: Diagnosis not present

## 2017-03-04 DIAGNOSIS — I10 Essential (primary) hypertension: Secondary | ICD-10-CM | POA: Diagnosis not present

## 2017-03-08 DIAGNOSIS — I482 Chronic atrial fibrillation: Secondary | ICD-10-CM | POA: Diagnosis not present

## 2017-03-08 DIAGNOSIS — Z7901 Long term (current) use of anticoagulants: Secondary | ICD-10-CM | POA: Diagnosis not present

## 2017-03-17 ENCOUNTER — Telehealth: Payer: Self-pay | Admitting: Internal Medicine

## 2017-03-17 NOTE — Telephone Encounter (Signed)
New message    Pt is calling about scheduling his procedure. He said he would like to do it on 7/23.

## 2017-03-23 ENCOUNTER — Other Ambulatory Visit: Payer: Self-pay

## 2017-03-23 DIAGNOSIS — Z7901 Long term (current) use of anticoagulants: Secondary | ICD-10-CM | POA: Diagnosis not present

## 2017-03-23 DIAGNOSIS — I480 Paroxysmal atrial fibrillation: Secondary | ICD-10-CM

## 2017-03-23 DIAGNOSIS — I482 Chronic atrial fibrillation: Secondary | ICD-10-CM | POA: Diagnosis not present

## 2017-03-26 ENCOUNTER — Encounter (INDEPENDENT_AMBULATORY_CARE_PROVIDER_SITE_OTHER): Payer: Self-pay

## 2017-03-26 ENCOUNTER — Other Ambulatory Visit: Payer: Medicare Other | Admitting: *Deleted

## 2017-03-26 DIAGNOSIS — I480 Paroxysmal atrial fibrillation: Secondary | ICD-10-CM

## 2017-03-26 LAB — CBC WITH DIFFERENTIAL/PLATELET
BASOS: 0 %
Basophils Absolute: 0 10*3/uL (ref 0.0–0.2)
EOS (ABSOLUTE): 0.6 10*3/uL — AB (ref 0.0–0.4)
EOS: 6 %
Hematocrit: 35.7 % — ABNORMAL LOW (ref 37.5–51.0)
Hemoglobin: 12.7 g/dL — ABNORMAL LOW (ref 13.0–17.7)
LYMPHS ABS: 1.9 10*3/uL (ref 0.7–3.1)
Lymphs: 22 %
MCH: 28.5 pg (ref 26.6–33.0)
MCHC: 35.6 g/dL (ref 31.5–35.7)
MCV: 80 fL (ref 79–97)
MONOS ABS: 0.9 10*3/uL (ref 0.1–0.9)
Monocytes: 10 %
NEUTROS PCT: 62 %
Neutrophils Absolute: 5.3 10*3/uL (ref 1.4–7.0)
PLATELETS: 182 10*3/uL (ref 150–379)
RBC: 4.45 x10E6/uL (ref 4.14–5.80)
RDW: 14.6 % (ref 12.3–15.4)
WBC: 8.6 10*3/uL (ref 3.4–10.8)

## 2017-03-26 LAB — BASIC METABOLIC PANEL
BUN / CREAT RATIO: 10 (ref 10–24)
BUN: 15 mg/dL (ref 8–27)
CHLORIDE: 103 mmol/L (ref 96–106)
CO2: 25 mmol/L (ref 20–29)
Calcium: 9.7 mg/dL (ref 8.6–10.2)
Creatinine, Ser: 1.43 mg/dL — ABNORMAL HIGH (ref 0.76–1.27)
GFR calc Af Amer: 54 mL/min/{1.73_m2} — ABNORMAL LOW (ref >59)
GFR calc non Af Amer: 47 mL/min/{1.73_m2} — ABNORMAL LOW (ref >59)
GLUCOSE: 129 mg/dL — AB (ref 65–99)
POTASSIUM: 4.3 mmol/L (ref 3.5–5.2)
SODIUM: 137 mmol/L (ref 134–144)

## 2017-03-26 LAB — PROTIME-INR
INR: 2.7 — AB (ref 0.8–1.2)
PROTHROMBIN TIME: 26.3 s — AB (ref 9.1–12.0)

## 2017-03-29 ENCOUNTER — Encounter (HOSPITAL_COMMUNITY)
Admission: RE | Disposition: A | Discharge status: Home / Self Care | Payer: Self-pay | Source: Ambulatory Visit | Attending: Internal Medicine

## 2017-03-29 ENCOUNTER — Ambulatory Visit (HOSPITAL_COMMUNITY)
Admission: RE | Admit: 2017-03-29 | Discharge: 2017-03-29 | Disposition: A | Discharge status: Home / Self Care | Payer: Medicare Other | Source: Ambulatory Visit | Attending: Internal Medicine | Admitting: Internal Medicine

## 2017-03-29 DIAGNOSIS — N400 Enlarged prostate without lower urinary tract symptoms: Secondary | ICD-10-CM | POA: Diagnosis not present

## 2017-03-29 DIAGNOSIS — Z87891 Personal history of nicotine dependence: Secondary | ICD-10-CM | POA: Insufficient documentation

## 2017-03-29 DIAGNOSIS — Z7901 Long term (current) use of anticoagulants: Secondary | ICD-10-CM | POA: Insufficient documentation

## 2017-03-29 DIAGNOSIS — I081 Rheumatic disorders of both mitral and tricuspid valves: Secondary | ICD-10-CM | POA: Insufficient documentation

## 2017-03-29 DIAGNOSIS — I5032 Chronic diastolic (congestive) heart failure: Secondary | ICD-10-CM | POA: Insufficient documentation

## 2017-03-29 DIAGNOSIS — N289 Disorder of kidney and ureter, unspecified: Secondary | ICD-10-CM | POA: Diagnosis not present

## 2017-03-29 DIAGNOSIS — I480 Paroxysmal atrial fibrillation: Secondary | ICD-10-CM | POA: Insufficient documentation

## 2017-03-29 DIAGNOSIS — I4892 Unspecified atrial flutter: Secondary | ICD-10-CM | POA: Diagnosis not present

## 2017-03-29 DIAGNOSIS — E7800 Pure hypercholesterolemia, unspecified: Secondary | ICD-10-CM | POA: Diagnosis not present

## 2017-03-29 DIAGNOSIS — E119 Type 2 diabetes mellitus without complications: Secondary | ICD-10-CM | POA: Diagnosis not present

## 2017-03-29 DIAGNOSIS — I110 Hypertensive heart disease with heart failure: Secondary | ICD-10-CM | POA: Insufficient documentation

## 2017-03-29 DIAGNOSIS — Z950 Presence of cardiac pacemaker: Secondary | ICD-10-CM | POA: Diagnosis not present

## 2017-03-29 DIAGNOSIS — Z7984 Long term (current) use of oral hypoglycemic drugs: Secondary | ICD-10-CM | POA: Insufficient documentation

## 2017-03-29 DIAGNOSIS — I495 Sick sinus syndrome: Secondary | ICD-10-CM | POA: Diagnosis not present

## 2017-03-29 HISTORY — PX: A-FLUTTER ABLATION: EP1230

## 2017-03-29 LAB — PROTIME-INR
INR: 2.43
PROTHROMBIN TIME: 26.9 s — AB (ref 11.4–15.2)

## 2017-03-29 LAB — GLUCOSE, CAPILLARY
Glucose-Capillary: 130 mg/dL — ABNORMAL HIGH (ref 65–99)
Glucose-Capillary: 106 mg/dL — ABNORMAL HIGH (ref 65–99)

## 2017-03-29 SURGERY — A-FLUTTER ABLATION

## 2017-03-29 MED ORDER — FENTANYL CITRATE (PF) 100 MCG/2ML IJ SOLN
INTRAMUSCULAR | Status: DC | PRN
  Administered 2017-03-29 (×2): 25 ug via INTRAVENOUS
  Administered 2017-03-29 (×2): 12.5 ug via INTRAVENOUS

## 2017-03-29 MED ORDER — HEPARIN (PORCINE) IN NACL 2-0.9 UNIT/ML-% IJ SOLN
INTRAMUSCULAR | Status: AC | PRN
  Administered 2017-03-29: 500 mL

## 2017-03-29 MED ORDER — BUPIVACAINE HCL (PF) 0.25 % IJ SOLN
INTRAMUSCULAR | Status: AC
  Filled 2017-03-29: qty 60

## 2017-03-29 MED ORDER — METFORMIN HCL ER 500 MG PO TB24: 1000.0000 mg | ORAL_TABLET | Freq: Two times a day (BID) | ORAL | Status: DC

## 2017-03-29 MED ORDER — FUROSEMIDE 40 MG PO TABS
40.0000 mg | ORAL_TABLET | Freq: Every day | ORAL | Status: DC
  Administered 2017-03-29: 40 mg via ORAL
  Filled 2017-03-29: qty 1

## 2017-03-29 MED ORDER — ACETAMINOPHEN 325 MG PO TABS
650.0000 mg | ORAL_TABLET | ORAL | Status: DC | PRN
Start: 2017-03-29 — End: 2017-03-29

## 2017-03-29 MED ORDER — MIDAZOLAM HCL 5 MG/5ML IJ SOLN
INTRAMUSCULAR | Status: AC
  Filled 2017-03-29: qty 5

## 2017-03-29 MED ORDER — SODIUM CHLORIDE 0.9% FLUSH: 3.0000 mL | Freq: Two times a day (BID) | INTRAVENOUS | Status: DC

## 2017-03-29 MED ORDER — MIDAZOLAM HCL 5 MG/5ML IJ SOLN
INTRAMUSCULAR | Status: DC | PRN
  Administered 2017-03-29 (×2): 1 mg via INTRAVENOUS
  Administered 2017-03-29: 2 mg via INTRAVENOUS

## 2017-03-29 MED ORDER — ATORVASTATIN CALCIUM 20 MG PO TABS: 20.0000 mg | ORAL_TABLET | Freq: Every day | ORAL | Status: DC

## 2017-03-29 MED ORDER — FINASTERIDE 5 MG PO TABS: 5.0000 mg | ORAL_TABLET | Freq: Every day | ORAL | Status: DC

## 2017-03-29 MED ORDER — ONDANSETRON HCL 4 MG/2ML IJ SOLN: 4.0000 mg | Freq: Four times a day (QID) | INTRAMUSCULAR | Status: DC | PRN

## 2017-03-29 MED ORDER — TAMSULOSIN HCL 0.4 MG PO CAPS: 0.8000 mg | ORAL_CAPSULE | Freq: Every day | ORAL | Status: DC

## 2017-03-29 MED ORDER — HEPARIN (PORCINE) IN NACL 2-0.9 UNIT/ML-% IJ SOLN
INTRAMUSCULAR | Status: AC
  Filled 2017-03-29: qty 500

## 2017-03-29 MED ORDER — GLIMEPIRIDE 4 MG PO TABS: 4.0000 mg | ORAL_TABLET | Freq: Every day | ORAL | Status: DC

## 2017-03-29 MED ORDER — VITAMIN D 50 MCG (2000 UT) PO CAPS: 2000.0000 [IU] | ORAL_CAPSULE | Freq: Every day | ORAL | Status: DC | PRN

## 2017-03-29 MED ORDER — WARFARIN SODIUM 2.5 MG PO TABS: 2.5000 mg | ORAL_TABLET | Freq: Every day | ORAL | Status: DC

## 2017-03-29 MED ORDER — SODIUM CHLORIDE 0.9% FLUSH: 3.0000 mL | INTRAVENOUS | Status: DC | PRN

## 2017-03-29 MED ORDER — SODIUM CHLORIDE 0.9 % IV SOLN
250.0000 mL | INTRAVENOUS | Status: DC | PRN
Start: 2017-03-29 — End: 2017-03-29

## 2017-03-29 MED ORDER — SODIUM CHLORIDE 0.9 % IV SOLN
INTRAVENOUS | Status: DC
  Administered 2017-03-29: 11:00:00 via INTRAVENOUS

## 2017-03-29 MED ORDER — LISINOPRIL 20 MG PO TABS
20.0000 mg | ORAL_TABLET | Freq: Every day | ORAL | Status: DC
  Administered 2017-03-29: 20 mg via ORAL
  Filled 2017-03-29: qty 1

## 2017-03-29 MED ORDER — FENTANYL CITRATE (PF) 100 MCG/2ML IJ SOLN
INTRAMUSCULAR | Status: AC
  Filled 2017-03-29: qty 2

## 2017-03-29 MED ORDER — BUPIVACAINE HCL (PF) 0.25 % IJ SOLN
INTRAMUSCULAR | Status: DC | PRN
  Administered 2017-03-29: 40 mL

## 2017-03-29 SURGICAL SUPPLY — 11 items
BAG SNAP BAND KOVER 36X36 (MISCELLANEOUS) ×3 IMPLANT
CATH EZ STEER NAV 8MM F-J CUR (ABLATOR) ×3 IMPLANT
CATH JOSEPHSON QUAD-ALLRED 6FR (CATHETERS) ×3 IMPLANT
CATH WEBSTER BI DIR CS D-F CRV (CATHETERS) ×3 IMPLANT
PACK EP LATEX FREE (CUSTOM PROCEDURE TRAY) ×2
PACK EP LF (CUSTOM PROCEDURE TRAY) ×1 IMPLANT
PAD DEFIB LIFELINK (PAD) ×3 IMPLANT
PATCH CARTO3 (PAD) ×3 IMPLANT
SHEATH PINNACLE 6F 10CM (SHEATH) ×3 IMPLANT
SHEATH PINNACLE 7F 10CM (SHEATH) ×3 IMPLANT
SHEATH PINNACLE 8F 10CM (SHEATH) ×3 IMPLANT

## 2017-03-29 NOTE — Progress Notes (Addendum)
Site area: RFV x 3 Site Prior to Removal:  Level  Pressure Applied For:25 min Manual:yes    Patient Status During Pull:  stable Post Pull Site:  Level 0 Post Pull Instructions Given: yes  Post Pull Pulses Present: palpable Dressing Applied: tegaderm  Bedrest begins @ 1440 till 2040 Comments:removed  By Juanda Crumble M./canderson

## 2017-03-29 NOTE — H&P (Signed)
HPI [name] returns today for ongoing evaluation and management of atrial flutter. He is a 76 yo man with CHB, sinus node dysfunction who underwent PPM insertion months ago in November 2017. In the interim, he has developed atrial flutter. He is sedentary but denies chest pain. He has dyspnea which is ok at rest but worsens with activity, particularly since he has been in atrial flutter.He had not had his INR's monitored when I saw him last.  No Known Allergies         Current Outpatient Prescriptions  Medication Sig Dispense Refill  . Ascorbic Acid (VITAMIN C) 1000 MG tablet Take 1,000 mg by mouth daily.    Marland Kitchen atorvastatin (LIPITOR) 20 MG tablet Take 20 mg by mouth at bedtime.  0  . finasteride (PROSCAR) 5 MG tablet Take 5 mg by mouth daily with breakfast.     . furosemide (LASIX) 40 MG tablet take 1 tablet by mouth once daily 30 tablet 9  . glimepiride (AMARYL) 4 MG tablet Take 4 mg by mouth daily with breakfast.    . guaiFENesin (MUCINEX) 600 MG 12 hr tablet Take 600 mg by mouth 2 (two) times daily as needed for cough or to loosen phlegm.     Marland Kitchen lisinopril (PRINIVIL,ZESTRIL) 20 MG tablet Take 20 mg by mouth daily with breakfast.     . metFORMIN (GLUCOPHAGE-XR) 500 MG 24 hr tablet Take 1,000 mg by mouth 2 (two) times daily.  0  . tamsulosin (FLOMAX) 0.4 MG CAPS capsule Take 0.8 mg by mouth daily after breakfast.     . warfarin (COUMADIN) 5 MG tablet Take 2.5-5 mg by mouth daily. Takes 5mg  everyday except Sunday takes 2.5mg      No current facility-administered medications for this visit.          Past Medical History:  Diagnosis Date  . BPH (benign prostatic hyperplasia)   . Chronic diastolic CHF (congestive heart failure) ([place])   . Demand ischemia ([place])    a. elev trop 07/2016 in setting of demand ischemia from CHF/bradycardia - no sig CAD by cath at that time.  . Diabetes mellitus without complication ([place])   . High cholesterol   .  Hypertension   . Junctional bradycardia    a. s/p Boston Sci ppm 07/2016.  . Mitral regurgitation   . PAF (paroxysmal atrial fibrillation) ([place])   . Renal insufficiency   . Right ventricular dysfunction   . S/P cardiac pacemaker procedure   . Tricuspid regurgitation     ROS:   All systems reviewed and negative except as noted in the HPI.   Past Surgical History:  Procedure Laterality Date  . CARDIAC CATHETERIZATION N/A 07/23/2016   Procedure: Right/Left Heart Cath and Coronary Angiography;  Surgeon: Larey Dresser, MD;  Location: [place] [facility];  Service: Cardiovascular;  Laterality: N/A;  . EP IMPLANTABLE DEVICE N/A 07/23/2016   Procedure: Pacemaker Implant;  Surgeon: Evans Lance, MD;  Location: [place] [facility];  Service: Cardiovascular;  Laterality: N/A;     History reviewed. No pertinent family history.   Social History        Social History  . Marital status: Divorced    Spouse name: N/A  . Number of children: N/A  . Years of education: N/A      Occupational History  . Not on file.   Social History Main Topics  . Smoking status: Former Research scientist (life sciences)  . Smokeless tobacco: Never Used  .  Alcohol use No     Comment: 2-3/wk  . Drug use: No  . Sexual activity: Not on file       Other Topics Concern  . Not on file      Social History Narrative  . No narrative on file     BP 122/66   Pulse 70   Ht 5\' 10"  (1.778 m)   Wt 249 lb (112.9 kg)   SpO2 97%   BMI 35.73 kg/m   Physical Exam:  Well appearing 76 yo man, NAD HEENT: Unremarkable Neck:  6 cm JVD, no thyromegally Lymphatics:  No adenopathy Back:  No CVA tenderness Lungs:  Clear with no wheezes, minimal basilar rales HEART:  Regular rate rhythm, no murmurs, no rubs, no clicks Abd:  soft, positive bowel sounds, no organomegally, no rebound, no guarding Ext:  2 plus pulses, no edema, no cyanosis, no clubbing Skin:  No rashes no nodules Neuro:  CN II  through XII intact, motor grossly intact  EKG - reviewed- atrial flutter with ventricular pacing.  DEVICE  Normal [place] DDD PM function.  See PaceArt for details. Atrial flutter  Assess/Plan: 1. CHB - he is s/p PPM insertion.  2. Sinus node dysfunction - he is asymptomatic. 3. PPM - his [employer] device is working normally. We will recheck in several months. 4. Atrial flutter - I went back and looked at his ECG's. He remains in atrial flutter over the past 3 months and I have recommended her undergo EP study and catheter ablation. The risks/benefits/goals/expectations have been discussed and he is considering his options and will call us if he wishes to undergo ablation.   Mikle Bosworth.D

## 2017-03-29 NOTE — Discharge Instructions (Signed)
Femoral Site Care Refer to this sheet in the next few weeks. These instructions provide you with information about caring for yourself after your procedure. Your health care provider may also give you more specific instructions. Your treatment has been planned according to current medical practices, but problems sometimes occur. Call your health care provider if you have any problems or questions after your procedure. What can I expect after the procedure? After your procedure, it is typical to have the following:  Bruising at the site that usually fades within 1-2 weeks.  Blood collecting in the tissue (hematoma) that may be painful to the touch. It should usually decrease in size and tenderness within 1-2 weeks.  Follow these instructions at home:  Take medicines only as directed by your health care provider.  You may shower 24-48 hours after the procedure or as directed by your health care provider. Remove the bandage (dressing) and gently wash the site with plain soap and water. Pat the area dry with a clean towel. Do not rub the site, because this may cause bleeding.  Do not take baths, swim, or use a hot tub until your health care provider approves.  Check your insertion site every day for redness, swelling, or drainage.  Do not apply powder or lotion to the site.  Limit use of stairs to twice a day for the first 2-3 days or as directed by your health care provider.  Do not squat for the first 2-3 days or as directed by your health care provider.  Do not lift over 10 lb (4.5 kg) for 5 days after your procedure or as directed by your health care provider.  Ask your health care provider when it is okay to: ? Return to work or school. ? Resume usual physical activities or sports. ? Resume sexual activity.  Do not drive home if you are discharged the same day as the procedure. Have someone else drive you.  You may drive 24 hours after the procedure unless otherwise instructed by  your health care provider.  Do not operate machinery or power tools for 24 hours after the procedure or as directed by your health care provider.  If your procedure was done as an outpatient procedure, which means that you went home the same day as your procedure, a responsible adult should be with you for the first 24 hours after you arrive home.  Keep all follow-up visits as directed by your health care provider. This is important. Contact a health care provider if:  You have a fever.  You have chills.  You have increased bleeding from the site. Hold pressure on the site. Get help right away if:  You have unusual pain at the site.  You have redness, warmth, or swelling at the site.  You have drainage (other than a small amount of blood on the dressing) from the site.  The site is bleeding, and the bleeding does not stop after 30 minutes of holding steady pressure on the site.  Your leg or foot becomes pale, cool, tingly, or numb. This information is not intended to replace advice given to you by your health care provider. Make sure you discuss any questions you have with your health care provider. Document Released: 04/27/2014 Document Revised: 01/30/2016 Document Reviewed: 03/13/2014 Elsevier Interactive Patient Education  2018 [employer].   Cardiac Ablation Cardiac ablation is a procedure to stop some heart tissue from causing problems. The heart has many electrical connections. Sometimes these connections make the  heart beat very fast or irregularly. Removing some problem areas can improve the heart rhythm or make it normal. What happens before the procedure?  Follow instructions from your doctor about what you cannot eat or drink.  Ask your doctor about: ? Changing or stopping your normal medicines. This is important if you take diabetes medicines or blood thinners. ? Taking medicines such as aspirin and ibuprofen. These medicines can thin your blood. Do not take these  medicines before your procedure if your doctor tells you not to.  Plan to have someone take you home.  If you will be going home right after the procedure, plan to have someone with you for 24 hours. What happens during the procedure?  To lower your risk of infection: ? Your health care team will wash or sanitize their hands. ? Your skin will be washed with soap. ? Hair may be removed from your neck or groin.  An IV tube will be put into one of your veins.  You will be given a medicine to help you relax (sedative).  Skin on your neck or groin will be numbed.  A cut (incision) will be made in your neck or groin.  A needle will be put through your cut and into a vein in your neck or groin.  A tube (catheter) will be put into the needle. The tube will be moved to your heart. X-rays (fluoroscopy) will be used to help guide the tube.  Small devices (electrodes) on the tip of the tube will send out electrical currents.  Dye may be put through the tube. This helps your surgeon see your heart.  Electrical energy will be used to scar (ablate) some heart tissue. Your surgeon may use: ? Heat (radiofrequency energy). ? Laser energy. ? Extreme cold (cryoablation).  The tube will be taken out.  Pressure will be held on your cut. This helps stop bleeding.  A bandage (dressing) will be put on your cut. The procedure may vary. What happens after the procedure?  You will be monitored until your medicines have worn off.  Your cut will be watched for bleeding. You will need to lie still for a few hours.  Do not drive for 24 hours or as long as your doctor tells you. Summary  Cardiac ablation is a procedure to stop some heart tissue from causing problems.  Electrical energy will be used to scar (ablate) some heart tissue. This information is not intended to replace advice given to you by your health care provider. Make sure you discuss any questions you have with your health care  provider. Document Released: 04/26/2013 Document Revised: 07/13/2016 Document Reviewed: 07/13/2016 Elsevier Interactive Patient Education  2017 [employer].

## 2017-03-30 ENCOUNTER — Encounter (HOSPITAL_COMMUNITY): Payer: Self-pay | Admitting: Internal Medicine

## 2017-03-30 ENCOUNTER — Ambulatory Visit: Payer: Medicare Other | Admitting: Podiatry

## 2017-04-13 ENCOUNTER — Ambulatory Visit (INDEPENDENT_AMBULATORY_CARE_PROVIDER_SITE_OTHER): Payer: Medicare Other | Admitting: Podiatry

## 2017-04-13 ENCOUNTER — Encounter: Payer: Self-pay | Admitting: Podiatry

## 2017-04-13 DIAGNOSIS — M79676 Pain in unspecified toe(s): Secondary | ICD-10-CM | POA: Diagnosis not present

## 2017-04-13 DIAGNOSIS — B351 Tinea unguium: Secondary | ICD-10-CM

## 2017-04-13 DIAGNOSIS — E099 Drug or chemical induced diabetes mellitus without complications: Secondary | ICD-10-CM | POA: Diagnosis not present

## 2017-04-13 DIAGNOSIS — T39395A Adverse effect of other nonsteroidal anti-inflammatory drugs [NSAID], initial encounter: Secondary | ICD-10-CM

## 2017-04-13 DIAGNOSIS — T50905A Adverse effect of unspecified drugs, medicaments and biological substances, initial encounter: Principal | ICD-10-CM

## 2017-04-13 NOTE — Progress Notes (Signed)
   Subjective:    Patient ID: Nicholas Harris, male    DOB: 1941/06/29, 76 y.o.   MRN: [id number]  HPI  Chief Complaint  Patient presents with  . Nail Problem    thick and discolored nails on both feet 5-6 years   He presents today after being referred from his primary care doctor with a history of diabetes and hypercholesterolemia. He was referred here for his onychomycosis. He is requesting treatment of the onychomycosis rather than just nail trim.    Review of Systems  Constitutional: Positive for diaphoresis.  Cardiovascular: Positive for palpitations.  All other systems reviewed and are negative.      Objective:   Physical Exam: Vital signs stable alert and oriented 3. Pulses are palpable. Neurologic sensorium is intact. Deep tendon reflexes are intact. Muscle strength +5 over 5 dorsiflexion plantar flexors and inverters everters all intrinsic musculature is intact. Orthopedic evaluation demonstrates all joints distal to the ankle range of motion without crepitation. Multiple hammertoe deformities are noted A some dramatic. Cutaneous evaluation demonstrates well-hydrated supple cutis with exception of thick yellow dystrophic onychomycotic nails particularly the hallux bilaterally. No open lesions or wounds.        Assessment & Plan:  Assessment: Diabetes without complications. Pain limb secondary to onychomycosis.  Plan: Debridement of toenails 1 through 5 bilateral today. I took some samples of the toenails and sent for pathologic evaluation. We will discuss the results in 1 month.

## 2017-04-22 DIAGNOSIS — I482 Chronic atrial fibrillation: Secondary | ICD-10-CM | POA: Diagnosis not present

## 2017-04-22 DIAGNOSIS — Z7901 Long term (current) use of anticoagulants: Secondary | ICD-10-CM | POA: Diagnosis not present

## 2017-04-27 ENCOUNTER — Encounter (INDEPENDENT_AMBULATORY_CARE_PROVIDER_SITE_OTHER): Payer: Self-pay

## 2017-04-27 ENCOUNTER — Ambulatory Visit (INDEPENDENT_AMBULATORY_CARE_PROVIDER_SITE_OTHER): Payer: Medicare Other | Admitting: Internal Medicine

## 2017-04-27 ENCOUNTER — Encounter: Payer: Self-pay | Admitting: Internal Medicine

## 2017-04-27 VITALS — BP 140/74 | HR 73 | Ht 71.0 in | Wt 254.6 lb

## 2017-04-27 DIAGNOSIS — I480 Paroxysmal atrial fibrillation: Secondary | ICD-10-CM | POA: Diagnosis not present

## 2017-04-27 DIAGNOSIS — I495 Sick sinus syndrome: Secondary | ICD-10-CM | POA: Diagnosis not present

## 2017-04-27 DIAGNOSIS — I442 Atrioventricular block, complete: Secondary | ICD-10-CM | POA: Diagnosis not present

## 2017-04-27 DIAGNOSIS — R61 Generalized hyperhidrosis: Secondary | ICD-10-CM

## 2017-04-27 LAB — CBC
HEMATOCRIT: 35.2 % — AB (ref 37.5–51.0)
HEMOGLOBIN: 12.1 g/dL — AB (ref 13.0–17.7)
MCH: 28.8 pg (ref 26.6–33.0)
MCHC: 34.4 g/dL (ref 31.5–35.7)
MCV: 84 fL (ref 79–97)
Platelets: 188 10*3/uL (ref 150–379)
RBC: 4.2 x10E6/uL (ref 4.14–5.80)
RDW: 14.2 % (ref 12.3–15.4)
WBC: 9.1 10*3/uL (ref 3.4–10.8)

## 2017-04-27 LAB — T4, FREE: FREE T4: 1.13 ng/dL (ref 0.82–1.77)

## 2017-04-27 LAB — TSH: TSH: 3.59 u[IU]/mL (ref 0.450–4.500)

## 2017-04-27 NOTE — Progress Notes (Signed)
HPI Nicholas Harris returns for follow-up. He is a pleasant 76 year old man with a history of complete heart block, status post pacemaker insertion. Nicholas Harris developed atrial flutter and underwent catheter ablation approximately one month ago. In Nicholas interim, he complains of excessive sweating and some fatigue. He has not had syncope. He denies night sweats. No chest pain. He has some dyspnea. He admits to sodium indiscretion. He eats out almost every meal. No Known Allergies   Current Outpatient Prescriptions  Medication Sig Dispense Refill  . atorvastatin (LIPITOR) 20 MG tablet Take 20 mg by mouth at bedtime.  0  . Cholecalciferol (VITAMIN D) 2000 units CAPS Take 2,000 Units by mouth daily as needed (leg problems).    . finasteride (PROSCAR) 5 MG tablet Take 5 mg by mouth daily with breakfast.     . furosemide (LASIX) 40 MG tablet take 1 tablet by mouth once daily 30 tablet 9  . glimepiride (AMARYL) 4 MG tablet Take 4 mg by mouth daily with breakfast.    . lisinopril (PRINIVIL,ZESTRIL) 20 MG tablet Take 20 mg by mouth daily.     . metFORMIN (GLUCOPHAGE-XR) 500 MG 24 hr tablet Take 1,000 mg by mouth 2 (two) times daily.  0  . tamsulosin (FLOMAX) 0.4 MG CAPS capsule Take 0.8 mg by mouth daily.     Marland Kitchen warfarin (COUMADIN) 5 MG tablet Take 2.5-5 mg by mouth daily. Takes 5mg  everyday except skip dose Sunday     No current facility-administered medications for this visit.      Past Medical History:  Diagnosis Date  . BPH (benign prostatic hyperplasia)   . Chronic diastolic CHF (congestive heart failure) ([place])   . Demand ischemia ([place])    a. elev trop 07/2016 in setting of demand ischemia from CHF/bradycardia - no sig CAD by cath at that time.  . Diabetes mellitus without complication ([place])   . High cholesterol   . Hypertension   . Junctional bradycardia    a. s/p [employer] Sci ppm 07/2016.  . Mitral regurgitation   . PAF (paroxysmal atrial fibrillation) ([place])   . Renal insufficiency    . Right ventricular dysfunction   . S/P cardiac pacemaker procedure   . Tricuspid regurgitation     ROS:   All systems reviewed and negative except as noted in Nicholas HPI.   Past Surgical History:  Procedure Laterality Date  . A-FLUTTER ABLATION N/A 03/29/2017   Procedure: A-Flutter Ablation;  Surgeon: Evans Lance, MD;  Location: [place] [place];  Service: Cardiovascular;  Laterality: N/A;  . CARDIAC CATHETERIZATION N/A 07/23/2016   Procedure: Right/Left Heart Cath and Coronary Angiography;  Surgeon: Larey Dresser, MD;  Location: [facility];  Service: Cardiovascular;  Laterality: N/A;  . EP IMPLANTABLE DEVICE N/A 07/23/2016   Procedure: Pacemaker Implant;  Surgeon: Evans Lance, MD;  Location: [facility];  Service: Cardiovascular;  Laterality: N/A;     No family history on file.   Social History   Social History  . Marital status: Divorced    Spouse name: N/A  . Number of children: N/A  . Years of education: N/A   Occupational History  . Not on file.   Social History Main Topics  . Smoking status: Former Research scientist (life sciences)  . Smokeless tobacco: Never Used  . Alcohol use No     Comment: 2-3/wk  . Drug use: No  . Sexual activity: Not on file   Other Topics Concern  .  Not on file   Social History Narrative  . No narrative on file     BP 140/74   Pulse 73   Ht 5\' 11"  (1.803 m)   Wt 254 lb 9.6 oz (115.5 kg)   BMI 35.51 kg/m   Physical Exam:  Well appearing 76 year old man, NAD HEENT: Unremarkable Neck:  6 cm JVD, no thyromegally Lymphatics:  No adenopathy Back:  No CVA tenderness Lungs:  Clear with no wheezes, rales, or rhonchi HEART:  Regular rate rhythm, no murmurs, no rubs, no clicks Abd:  soft, positive bowel sounds, no organomegally, no rebound, no guarding Ext:  2 plus pulses, no edema, no cyanosis, no clubbing Skin:  No rashes no nodules Neuro:  CN II through XII intact, motor grossly intact  EKG - sinus rhythm with AV sequential  pacing  DEVICE  Normal device function.  See PaceArt for details. Review of his electrograms demonstrates normal sinus rhythm since his catheter ablation.  Assess/Plan: 1. Atrial flutter - he is doing well status post catheter ablation. He has had no recurrent atrial arrhythmias since his ablation. I will ask Nicholas Harris to discontinue his systemic anticoagulation for now. He does carry a diagnosis of atrial fibrillation which I suspect is in error and really represents atrial flutter. 2. Unexplained sweating - etiology is unclear. I will asked Nicholas Harris to obtain thyroid function studies today. He does not have any other infectious type symptoms however we will obtain a CBC as well. 3. Chronic diastolic heart failure - Nicholas Harris admits to sodium indiscretion. His symptoms are class II at worst. I've encouraged Nicholas Harris to reduce his salt intake. He will continue his current medications for now. 4. Dyslipidemia - he will continue moderate intensity statin therapy.

## 2017-04-27 NOTE — Patient Instructions (Addendum)
Medication Instruction: Your physician has recommended you make the following change in your medication: 1.  Stop taking your warfarin.    Labwork: You will get a TSH, free T4 and CBC today.     Testing/Procedures: None ordered.   Follow-Up: Your physician wants you to follow-up in: 3 months with Dr. Lovena Le.   You will receive a reminder letter in the mail two months in advance. If you don't receive a letter, please call our office to schedule the follow-up appointment.  If you lab work is abnormal we may have you come back sooner.  We will call you with results.  Remote monitoring is used to monitor your Pacemaker from home. This monitoring reduces the number of office visits required to check your device to one time per year. It allows [place] to keep an eye on the functioning of your device to ensure it is working properly. You are scheduled for a device check from home on 07/27/2017. You may send your transmission at any time that day. If you have a wireless device, the transmission will be sent automatically. After your physician reviews your transmission, you will receive a postcard with your next transmission date.    Any Other Special Instructions Will Be Listed Below (If Applicable).     If you need a refill on your cardiac medications before your next appointment, please call your pharmacy.

## 2017-05-04 ENCOUNTER — Encounter: Payer: Self-pay | Admitting: Podiatry

## 2017-05-04 ENCOUNTER — Ambulatory Visit (INDEPENDENT_AMBULATORY_CARE_PROVIDER_SITE_OTHER): Payer: Medicare Other | Admitting: Podiatry

## 2017-05-04 DIAGNOSIS — Z79899 Other long term (current) drug therapy: Secondary | ICD-10-CM | POA: Diagnosis not present

## 2017-05-04 DIAGNOSIS — L603 Nail dystrophy: Secondary | ICD-10-CM

## 2017-05-04 MED ORDER — TERBINAFINE HCL 250 MG PO TABS: 250.0000 mg | ORAL_TABLET | Freq: Every day | ORAL | 0 refills | Status: DC

## 2017-05-04 NOTE — Patient Instructions (Signed)

## 2017-05-04 NOTE — Progress Notes (Signed)
He presents today for follow-up of his fungus report.  Objective: Vital signs are stable alert and oriented 3. Pulses are palpable. Neurologic sensorium is intact. Deep tendon reflexes are intact. Muscle strength is normal and symmetrical bilateral. Orthopedic evaluation of strength all joints distal to the ankle for range of motion but crepitation. Cutaneous evaluations are supple hydrated cutis with exception of thick yellow dystrophic onychomycotic toenails. Toenails were proven to be fungal by pathology.  Assessment: Onychomycosis.  Plan: We discussed in great detail today the pros and cons of topical antifungal therapy for the nails as well as laser therapy and oral therapy. At this point with no history of liver problems or kidney problems we are going to start with oral therapy. Lamisil 250 mg tablets 1 by mouth daily 30 days. We also requested a liver profile to be performed and he was provided with both oral and going instructions as well as a requisition for blood work.

## 2017-05-06 DIAGNOSIS — I482 Chronic atrial fibrillation: Secondary | ICD-10-CM | POA: Diagnosis not present

## 2017-05-06 DIAGNOSIS — Z7901 Long term (current) use of anticoagulants: Secondary | ICD-10-CM | POA: Diagnosis not present

## 2017-06-08 ENCOUNTER — Ambulatory Visit (INDEPENDENT_AMBULATORY_CARE_PROVIDER_SITE_OTHER): Payer: Medicare Other | Admitting: Podiatry

## 2017-06-08 ENCOUNTER — Encounter: Payer: Self-pay | Admitting: Podiatry

## 2017-06-08 DIAGNOSIS — L603 Nail dystrophy: Secondary | ICD-10-CM

## 2017-06-08 DIAGNOSIS — Z79899 Other long term (current) drug therapy: Secondary | ICD-10-CM | POA: Diagnosis not present

## 2017-06-08 LAB — HEPATIC FUNCTION PANEL
AG Ratio: 1.7 (calc) (ref 1.0–2.5)
ALKALINE PHOSPHATASE (APISO): 75 U/L (ref 40–115)
ALT: 8 U/L — AB (ref 9–46)
AST: 14 U/L (ref 10–35)
Albumin: 4.5 g/dL (ref 3.6–5.1)
BILIRUBIN DIRECT: 0.2 mg/dL (ref 0.0–0.2)
BILIRUBIN INDIRECT: 0.6 mg/dL (ref 0.2–1.2)
BILIRUBIN TOTAL: 0.8 mg/dL (ref 0.2–1.2)
Globulin: 2.7 g/dL (calc) (ref 1.9–3.7)
Total Protein: 7.2 g/dL (ref 6.1–8.1)

## 2017-06-08 MED ORDER — TERBINAFINE HCL 250 MG PO TABS: 250.0000 mg | ORAL_TABLET | Freq: Every day | ORAL | 0 refills | Status: DC

## 2017-06-09 NOTE — Progress Notes (Signed)
He presents today for follow-up of his nail fungus had no problems taking the medication. States that no GI upset or dizziness. He did not get his lab work done was confused about that.  Objective: Vital signs are stable he is alert and oriented 3. No changes in his nails as of yet.  Assessment: Onychomycosis.  Plan: Requested that he get his blood work performed and started him on 90 days and Lamisil. Follow up with him in 4 months. He will notify us with any questions or concerns.  Lab work was returned by this morning his AST is on the low end of normal but generally if there is a liver problem with this and enzymes usually elevate so I see no problems taking the medication.

## 2017-06-10 ENCOUNTER — Telehealth: Payer: Self-pay | Admitting: *Deleted

## 2017-06-10 NOTE — Telephone Encounter (Addendum)
-----   Message from Garrel Ridgel, [place] sent at 06/09/2017  6:43 AM EDT ----- Blood work looks ok. May continue medication.06/10/2017-Left message informing pt of Dr. Stephenie Acres review of results and orders.

## 2017-06-12 ENCOUNTER — Other Ambulatory Visit: Payer: Self-pay | Admitting: Podiatry

## 2017-07-08 ENCOUNTER — Encounter (HOSPITAL_COMMUNITY): Payer: Self-pay

## 2017-07-08 ENCOUNTER — Ambulatory Visit (HOSPITAL_COMMUNITY)
Admission: RE | Admit: 2017-07-08 | Discharge: 2017-07-08 | Disposition: A | Discharge status: Home / Self Care | Payer: Medicare Other | Source: Ambulatory Visit | Attending: Internal Medicine | Admitting: Internal Medicine

## 2017-07-08 VITALS — BP 132/66 | HR 61 | Wt 262.6 lb

## 2017-07-08 DIAGNOSIS — E7800 Pure hypercholesterolemia, unspecified: Secondary | ICD-10-CM | POA: Diagnosis not present

## 2017-07-08 DIAGNOSIS — Z7901 Long term (current) use of anticoagulants: Secondary | ICD-10-CM | POA: Insufficient documentation

## 2017-07-08 DIAGNOSIS — Z79899 Other long term (current) drug therapy: Secondary | ICD-10-CM | POA: Insufficient documentation

## 2017-07-08 DIAGNOSIS — R001 Bradycardia, unspecified: Secondary | ICD-10-CM | POA: Insufficient documentation

## 2017-07-08 DIAGNOSIS — Z9889 Other specified postprocedural states: Secondary | ICD-10-CM | POA: Diagnosis not present

## 2017-07-08 DIAGNOSIS — E118 Type 2 diabetes mellitus with unspecified complications: Secondary | ICD-10-CM | POA: Diagnosis not present

## 2017-07-08 DIAGNOSIS — I5032 Chronic diastolic (congestive) heart failure: Secondary | ICD-10-CM | POA: Diagnosis not present

## 2017-07-08 DIAGNOSIS — N400 Enlarged prostate without lower urinary tract symptoms: Secondary | ICD-10-CM | POA: Insufficient documentation

## 2017-07-08 DIAGNOSIS — E119 Type 2 diabetes mellitus without complications: Secondary | ICD-10-CM | POA: Diagnosis not present

## 2017-07-08 DIAGNOSIS — I4892 Unspecified atrial flutter: Secondary | ICD-10-CM | POA: Insufficient documentation

## 2017-07-08 DIAGNOSIS — Z950 Presence of cardiac pacemaker: Secondary | ICD-10-CM | POA: Insufficient documentation

## 2017-07-08 DIAGNOSIS — I10 Essential (primary) hypertension: Secondary | ICD-10-CM

## 2017-07-08 DIAGNOSIS — I480 Paroxysmal atrial fibrillation: Secondary | ICD-10-CM | POA: Insufficient documentation

## 2017-07-08 DIAGNOSIS — I110 Hypertensive heart disease with heart failure: Secondary | ICD-10-CM | POA: Diagnosis not present

## 2017-07-08 DIAGNOSIS — I081 Rheumatic disorders of both mitral and tricuspid valves: Secondary | ICD-10-CM | POA: Diagnosis not present

## 2017-07-08 DIAGNOSIS — Z87891 Personal history of nicotine dependence: Secondary | ICD-10-CM | POA: Diagnosis not present

## 2017-07-08 DIAGNOSIS — Z7984 Long term (current) use of oral hypoglycemic drugs: Secondary | ICD-10-CM | POA: Diagnosis not present

## 2017-07-08 LAB — BASIC METABOLIC PANEL
ANION GAP: 7 (ref 5–15)
BUN: 10 mg/dL (ref 6–20)
CALCIUM: 8.9 mg/dL (ref 8.9–10.3)
CO2: 25 mmol/L (ref 22–32)
CREATININE: 1.27 mg/dL — AB (ref 0.61–1.24)
Chloride: 105 mmol/L (ref 101–111)
GFR, EST NON AFRICAN AMERICAN: 52 mL/min — AB (ref >60)
Glucose, Bld: 173 mg/dL — ABNORMAL HIGH (ref 65–99)
Potassium: 3.6 mmol/L (ref 3.5–5.1)
SODIUM: 137 mmol/L (ref 135–145)

## 2017-07-08 MED ORDER — AMLODIPINE BESYLATE 5 MG PO TABS: 5.0000 mg | ORAL_TABLET | Freq: Every day | ORAL | 6 refills | Status: DC

## 2017-07-08 NOTE — Progress Notes (Signed)
PCP: Dr Ashby Dawes Primary HF Cardiologist: Dr Aundra Dubin   EP  HPI: Nicholas Harris is a 76 y.o. male with history of HTN, DM2, hyperlipidemia, BPH and atrial fibrillation (determined paroxysmal by end of hospital stay).   Admitted 11/14 through 11/18 with increased dyspnea. He was found to have junctional bradycardia 45-50bpm, occasional dips into 30s as well as CHF. His troponin was elevated to 3.05, initially unclear if NSTEMI or demand ischemia related to acute heart failure. He was admitted for further evaluation and started on IV Lasix for diuresis, episodically requiring augmentation with metolazone. His amlodipine was stopped on admission. Coumadin was held with heparin crossover in prep for cath.  2D Echo 07/23/16 showing mild LVH, EF 65-70%, mild MR, mod LAE, mod dilated RV with moderately reduced RV function, mod-severe TR. He underwent [facility] 07/23/16 elevated R >L heart filling pressures with preserved cardiac output, no obstructive CAD. He was seen by EP this admission who felt he would benefit from permanent pacemaker - on 07/23/16 underwent [facility] (serial number [id number]) pacemaker implantation. His coumadin was restarted without heparin bridge given fresh implantation of device. With aggressive diuresis his weight went from 288lb on admission to 247lb on day of discharge.  S/p Aflutter ablation 03/29/17.   He presents today for regular follow up.  Doing very well overall. Denies SOB/PND/Orthopnea or peripheral edema. Sleeps on 2 pillows chronically. Has gained weight but does not feel like it is fluid. Taking all medications as directed. Lives alone. 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 Not very active otherwise.   EKG 07/08/17 50 bpm with AV dual-paced rhythm with prolonged AV conduction.  Echo 07/23/16 LVEF 65-70%, Mild MR, Mod LAE, RV moderately dilated/reduced, Severe RAE, Mod/Sev TR.  R/LHC 07/23/16 1. Angiography - No obstructive CAD. 2. Hemodynamics  (mmHg) RA mean 19 with cannon a waves to 27 RV 47/16 PA 52/15, mean 27 PCWP mean 22 with prominent v-waves LV 123/18 AO 124/59 Oxygen saturations: PA 60% AO 97% Cardiac Output (Fick) 5.74  Cardiac Index (Fick) 2.38  Review of systems complete and found to be negative unless listed in HPI.    SH:  Social History   Social History  . Marital status: Divorced    Spouse name: N/A  . Number of children: N/A  . Years of education: N/A   Occupational History  . Not on file.   Social History Main Topics  . Smoking status: Former Research scientist (life sciences)  . Smokeless tobacco: Never Used  . Alcohol use No     Comment: 2-3/wk  . Drug use: No  . Sexual activity: Not on file   Other Topics Concern  . Not on file   Social History Narrative  . No narrative on file   FH: No family history on file.  Past Medical History:  Diagnosis Date  . BPH (benign prostatic hyperplasia)   . Chronic diastolic CHF (congestive heart failure) ([place])   . Demand ischemia ([place])    a. elev trop 07/2016 in setting of demand ischemia from CHF/bradycardia - no sig CAD by cath at that time.  . Diabetes mellitus without complication ([place])   . High cholesterol   . Hypertension   . Junctional bradycardia    a. s/p [employer] Sci ppm 07/2016.  . Mitral regurgitation   . PAF (paroxysmal atrial fibrillation) ([place])   . Renal insufficiency   . Right ventricular dysfunction   . S/P cardiac pacemaker procedure   . Tricuspid regurgitation  Current Outpatient Prescriptions  Medication Sig Dispense Refill  . atorvastatin (LIPITOR) 20 MG tablet Take 20 mg by mouth at bedtime.  0  . Cholecalciferol (VITAMIN D) 2000 units CAPS Take 2,000 Units by mouth daily as needed (leg problems).    . finasteride (PROSCAR) 5 MG tablet Take 5 mg by mouth daily with breakfast.     . furosemide (LASIX) 40 MG tablet take 1 tablet by mouth once daily 30 tablet 9  . glimepiride (AMARYL) 4 MG tablet Take 4 mg by mouth daily with breakfast.    .  lisinopril (PRINIVIL,ZESTRIL) 20 MG tablet Take 20 mg by mouth daily.     . metFORMIN (GLUCOPHAGE-XR) 500 MG 24 hr tablet Take 1,000 mg by mouth 2 (two) times daily.  0  . tamsulosin (FLOMAX) 0.4 MG CAPS capsule Take 0.8 mg by mouth daily.     Marland Kitchen terbinafine (LAMISIL) 250 MG tablet Take 1 tablet (250 mg total) by mouth daily. 90 tablet 0  . terbinafine (LAMISIL) 250 MG tablet TAKE 1 TABLET( 250 MG TOTAL) BY MOUTH DAILY 30 tablet 0   No current facility-administered medications for this encounter.     Vitals:   07/08/17 1104  BP: 132/66  Pulse: 61  SpO2: 98%  Weight: 262 lb 9.6 oz (119.1 kg)   Wt Readings from Last 3 Encounters:  07/08/17 262 lb 9.6 oz (119.1 kg)  04/27/17 254 lb 9.6 oz (115.5 kg)  03/29/17 245 lb (111.1 kg)    PHYSICAL EXAM: General: Well appearing. No resp difficulty. HEENT: Normal Neck: Supple. JVP 5-6. Carotids 2+ bilat; no bruits. No thyromegaly or nodule noted. Cor: PMI nondisplaced. RRR, No M/G/R noted Lungs: CTAB, normal effort. Abdomen: Soft, non-tender, non-distended, no HSM. No bruits or masses. +BS  Extremities: No cyanosis, clubbing, or rash. R and LLE no edema.  Neuro: Alert & orientedx3, cranial nerves grossly intact. moves all 4 extremities w/o difficulty. Affect pleasant   ASSESSMENT & PLAN: 1. Chronic Diastolic CHF:- Echo 16/10/96 LVEF 65-70%, Mild MR, Mod LAE, RV moderately dilated/reduced, Severe RAE, Mod/Sev TR. Normal cors on cath 07/2016.  - Volume status stable despite weight gain.  - Continue lasix 40 mg daily.  - Continue lisinopril 20 mg daily.   - Reinforced fluid restriction to < 2 L daily, sodium restriction to less than 2000 mg daily, and the importance of daily weights.   2.  Atrial fibrillation: Paroxysmal versus persistent. Has been known for years, on warfarin at home. No nodal blockade among home meds. CHADSVASC = 5.  - Now s/p PPM.  - No nodal blockers. On coumadin. INR. Managed  3. Junctional bradycardia:  - s/p [place]  Sci PPM placement 07/23/16 by Dr. Lovena Le.  4. Diabetes: - Per PCP.  5. HTN - Add amlodipine 5 mg daily.  6. Morbid Obesity - Needs to lose weight.  - Encouraged portion control and increase activity as able.   Meds and labs as above. RTC 3 months with Dr. Leo Rod, PA-C  11:11 AM   Greater than 50% of the 25 minute visit was spent in counseling/coordination of care regarding disease state education, sliding scale diuretics, salt/fluid restriction, and medication reconciliation.

## 2017-07-08 NOTE — Patient Instructions (Signed)
START Amlodipine (Norvasc) 5 mg tablet once daily.  Routine lab work today. Will notify you of abnormal results, otherwise no news is good news!  Follow up 3 months with Dr. Aundra Dubin. We will call you closer to this time, or you may call our office to schedule 1 month before you are due to be seen. Take all medication as prescribed the day of your appointment. Bring all medications with you to your appointment.  Do the following things EVERYDAY: 1) Weigh yourself in the morning before breakfast. Write it down and keep it in a log. 2) Take your medicines as prescribed 3) Eat low salt foods-Limit salt (sodium) to 2000 mg per day.  4) Stay as active as you can everyday 5) Limit all fluids for the day to less than 2 liters

## 2017-07-14 DIAGNOSIS — R3915 Urgency of urination: Secondary | ICD-10-CM | POA: Diagnosis not present

## 2017-07-14 DIAGNOSIS — N401 Enlarged prostate with lower urinary tract symptoms: Secondary | ICD-10-CM | POA: Diagnosis not present

## 2017-07-14 DIAGNOSIS — R351 Nocturia: Secondary | ICD-10-CM | POA: Diagnosis not present

## 2017-07-27 ENCOUNTER — Ambulatory Visit (INDEPENDENT_AMBULATORY_CARE_PROVIDER_SITE_OTHER): Payer: Medicare Other | Admitting: *Deleted

## 2017-07-27 DIAGNOSIS — I495 Sick sinus syndrome: Secondary | ICD-10-CM

## 2017-07-28 ENCOUNTER — Ambulatory Visit (INDEPENDENT_AMBULATORY_CARE_PROVIDER_SITE_OTHER): Payer: Medicare Other | Admitting: Internal Medicine

## 2017-07-28 ENCOUNTER — Encounter: Payer: Self-pay | Admitting: Internal Medicine

## 2017-07-28 VITALS — BP 136/72 | HR 100 | Ht 71.0 in | Wt 263.0 lb

## 2017-07-28 DIAGNOSIS — I480 Paroxysmal atrial fibrillation: Secondary | ICD-10-CM | POA: Diagnosis not present

## 2017-07-28 DIAGNOSIS — I495 Sick sinus syndrome: Secondary | ICD-10-CM

## 2017-07-28 DIAGNOSIS — I4892 Unspecified atrial flutter: Secondary | ICD-10-CM | POA: Diagnosis not present

## 2017-07-28 DIAGNOSIS — R001 Bradycardia, unspecified: Secondary | ICD-10-CM

## 2017-07-28 DIAGNOSIS — I5032 Chronic diastolic (congestive) heart failure: Secondary | ICD-10-CM

## 2017-07-28 DIAGNOSIS — Z950 Presence of cardiac pacemaker: Secondary | ICD-10-CM

## 2017-07-28 LAB — CUP PACEART INCLINIC DEVICE CHECK
Date Time Interrogation Session: 20181121050000
Implantable Lead Implant Date: 20171116
Implantable Lead Location: 753859
Implantable Lead Model: 7741
Implantable Lead Serial Number: 821967
Implantable Pulse Generator Implant Date: 20171116
Lead Channel Impedance Value: 620 Ohm
Lead Channel Pacing Threshold Pulse Width: 0.4 ms
Lead Channel Sensing Intrinsic Amplitude: 2.4 mV
Lead Channel Setting Pacing Amplitude: 2 V
Lead Channel Setting Pacing Pulse Width: 0.4 ms
Lead Channel Setting Sensing Sensitivity: 2.5 mV
MDC IDC LEAD IMPLANT DT: 20171116
MDC IDC LEAD LOCATION: 753860
MDC IDC LEAD SERIAL: 743314
MDC IDC MSMT LEADCHNL RA IMPEDANCE VALUE: 702 Ohm
MDC IDC MSMT LEADCHNL RA PACING THRESHOLD AMPLITUDE: 1 V
MDC IDC MSMT LEADCHNL RA PACING THRESHOLD PULSEWIDTH: 0.4 ms
MDC IDC MSMT LEADCHNL RA SENSING INTR AMPL: 3.4 mV
MDC IDC MSMT LEADCHNL RV PACING THRESHOLD AMPLITUDE: 1.1 V
MDC IDC SET LEADCHNL RV PACING AMPLITUDE: 2.5 V
MDC IDC STAT BRADY RA PERCENT PACED: 72 %
MDC IDC STAT BRADY RV PERCENT PACED: 60 %
Pulse Gen Serial Number: 770005

## 2017-07-28 NOTE — Progress Notes (Signed)
HPI Mr. Nole returns today for ongoing evaluation and management of his PPM, HTN, dyslipidemia and paroxysmal atrial arrhythmias. He underwent catheter ablation several months ago. He has atrial fib listed as a problem but my review of all of the ECGs in Epic demonstrates no atrial fib. He admits to being sedentary. "I am a couch potato".  No Known Allergies   Current Outpatient Medications  Medication Sig Dispense Refill  . amLODipine (NORVASC) 5 MG tablet Take 1 tablet (5 mg total) by mouth daily. 30 tablet 6  . atorvastatin (LIPITOR) 20 MG tablet Take 20 mg by mouth at bedtime.  0  . Cholecalciferol (VITAMIN D) 2000 units CAPS Take 2,000 Units by mouth daily as needed (leg problems).    . finasteride (PROSCAR) 5 MG tablet Take 5 mg by mouth daily with breakfast.     . furosemide (LASIX) 40 MG tablet take 1 tablet by mouth once daily 30 tablet 9  . glimepiride (AMARYL) 4 MG tablet Take 4 mg by mouth daily with breakfast.    . lisinopril (PRINIVIL,ZESTRIL) 20 MG tablet Take 20 mg by mouth daily.     . metFORMIN (GLUCOPHAGE-XR) 500 MG 24 hr tablet Take 1,000 mg by mouth 2 (two) times daily.  0  . tamsulosin (FLOMAX) 0.4 MG CAPS capsule Take 0.8 mg by mouth daily.     Marland Kitchen terbinafine (LAMISIL) 250 MG tablet Take 1 tablet (250 mg total) by mouth daily. 90 tablet 0   No current facility-administered medications for this visit.      Past Medical History:  Diagnosis Date  . BPH (benign prostatic hyperplasia)   . Chronic diastolic CHF (congestive heart failure) ([place])   . Demand ischemia ([place])    a. elev trop 07/2016 in setting of demand ischemia from CHF/bradycardia - no sig CAD by cath at that time.  . Diabetes mellitus without complication ([place])   . High cholesterol   . Hypertension   . Junctional bradycardia    a. s/p [employer] Sci ppm 07/2016.  . Mitral regurgitation   . PAF (paroxysmal atrial fibrillation) ([place])   . Renal insufficiency   . Right ventricular dysfunction   .  S/P cardiac pacemaker procedure   . Tricuspid regurgitation     ROS:   All systems reviewed and negative except as noted in the HPI.   Past Surgical History:  Procedure Laterality Date  . A-FLUTTER ABLATION N/A 03/29/2017   Procedure: A-Flutter Ablation;  Surgeon: Evans Lance, MD;  Location: [place] [facility];  Service: Cardiovascular;  Laterality: N/A;  . CARDIAC CATHETERIZATION N/A 07/23/2016   Procedure: Right/Left Heart Cath and Coronary Angiography;  Surgeon: Larey Dresser, MD;  Location: [place] [facility];  Service: Cardiovascular;  Laterality: N/A;  . EP IMPLANTABLE DEVICE N/A 07/23/2016   Procedure: Pacemaker Implant;  Surgeon: Evans Lance, MD;  Location: [place] [facility];  Service: Cardiovascular;  Laterality: N/A;     No family history on file.   Social History   Socioeconomic History  . Marital status: Divorced    Spouse name: Not on file  . Number of children: Not on file  . Years of education: Not on file  . Highest education level: Not on file  Social Needs  . Financial resource strain: Not on file  . Food insecurity - worry: Not on file  . Food insecurity - inability: Not on file  . Transportation needs - medical: Not on file  . Transportation  needs - non-medical: Not on file  Occupational History  . Not on file  Tobacco Use  . Smoking status: Former Research scientist (life sciences)  . Smokeless tobacco: Never Used  Substance and Sexual Activity  . Alcohol use: No    Comment: 2-3/wk  . Drug use: No  . Sexual activity: Not on file  Other Topics Concern  . Not on file  Social History Narrative  . Not on file     BP 136/72   Pulse 100   Ht 5\' 11"  (1.803 m)   Wt 263 lb (119.3 kg)   SpO2 98%   BMI 36.68 kg/m   Physical Exam:  Well appearing NAD HEENT: Unremarkable Neck:  No JVD, no thyromegally Lymphatics:  No adenopathy Back:  No CVA tenderness Lungs:  Clear HEART:  Regular rate rhythm, no murmurs, no rubs, no clicks Abd:  soft, positive bowel  sounds, no organomegally, no rebound, no guarding Ext:  2 plus pulses, no edema, no cyanosis, no clubbing Skin:  No rashes no nodules Neuro:  CN II through XII intact, motor grossly intact  EKG - NSR with atrial pacing  DEVICE  Normal device function.  See PaceArt for details.   Assess/Plan: 1. Atrial flutter - he has had no recurrence since his ablation. He is off of anti-coag. 2. Atrial fib - I cannot find any evidence of this. If he were to develop atrial fib, then we would restart systemic anti-coagulation. Everything on his device is atrial flutter and prior to his ablation. 3. HTN - his blood pressure is controlled. I have encouraged the patient to increase his physical activity and eat less salty food and lose weight. 4. Obesity - I've encouraged the patient to lose 15 pounds by the time I see him next. He denies dietary indiscretion. His main problem is sedentary lifestyle. I've encouraged him on this as well.  Cristopher Peru, M.D.

## 2017-07-28 NOTE — Progress Notes (Signed)
Remote pacemaker transmission.   

## 2017-07-28 NOTE — Patient Instructions (Addendum)
Medication Instructions:  Your physician recommends that you continue on your current medications as directed. Please refer to the Current Medication list given to you today.  Labwork: None ordered.  Testing/Procedures: None ordered.  Follow-Up: Your physician wants you to follow-up in: one year with Dr. Lovena Le.   You will receive a reminder letter in the mail two months in advance. If you don't receive a letter, please call our office to schedule the follow-up appointment.  Remote monitoring is used to monitor your Pacemaker from home. This monitoring reduces the number of office visits required to check your device to one time per year. It allows [place] to keep an eye on the functioning of your device to ensure it is working properly. You are scheduled for a device check from home on 10/27/2017. You may send your transmission at any time that day. If you have a wireless device, the transmission will be sent automatically. After your physician reviews your transmission, you will receive a postcard with your next transmission date.     Any Other Special Instructions Will Be Listed Below (If Applicable).  1.  Try to lose weight 2.  Start walking   If you need a refill on your cardiac medications before your next appointment, please call your pharmacy.

## 2017-08-05 ENCOUNTER — Encounter: Payer: Self-pay | Admitting: Cardiology

## 2017-08-16 LAB — CUP PACEART REMOTE DEVICE CHECK
Brady Statistic RA Percent Paced: 72 %
Brady Statistic RV Percent Paced: 59 %
Implantable Lead Location: 753859
Implantable Lead Model: 7742
Implantable Lead Serial Number: 743314
Lead Channel Impedance Value: 717 Ohm
Lead Channel Setting Pacing Amplitude: 2 V
Lead Channel Setting Pacing Amplitude: 2.5 V
Lead Channel Setting Pacing Pulse Width: 0.4 ms
MDC IDC LEAD IMPLANT DT: 20171116
MDC IDC LEAD IMPLANT DT: 20171116
MDC IDC LEAD LOCATION: 753860
MDC IDC LEAD SERIAL: 821967
MDC IDC MSMT BATTERY REMAINING LONGEVITY: 168 mo
MDC IDC MSMT BATTERY REMAINING PERCENTAGE: 100 %
MDC IDC MSMT LEADCHNL RV IMPEDANCE VALUE: 674 Ohm
MDC IDC PG IMPLANT DT: 20171116
MDC IDC PG SERIAL: 770005
MDC IDC SESS DTM: 20181120052100
MDC IDC SET LEADCHNL RV SENSING SENSITIVITY: 2.5 mV

## 2017-08-18 ENCOUNTER — Other Ambulatory Visit: Payer: Self-pay | Admitting: Podiatry

## 2017-08-19 ENCOUNTER — Ambulatory Visit (INDEPENDENT_AMBULATORY_CARE_PROVIDER_SITE_OTHER): Payer: Medicare Other | Admitting: Physician Assistant

## 2017-10-05 DIAGNOSIS — E1165 Type 2 diabetes mellitus with hyperglycemia: Secondary | ICD-10-CM | POA: Diagnosis not present

## 2017-10-05 DIAGNOSIS — N390 Urinary tract infection, site not specified: Secondary | ICD-10-CM | POA: Diagnosis not present

## 2017-10-05 DIAGNOSIS — E782 Mixed hyperlipidemia: Secondary | ICD-10-CM | POA: Diagnosis not present

## 2017-10-12 ENCOUNTER — Ambulatory Visit: Payer: Medicare Other | Admitting: Podiatry

## 2017-10-12 DIAGNOSIS — I10 Essential (primary) hypertension: Secondary | ICD-10-CM | POA: Diagnosis not present

## 2017-10-12 DIAGNOSIS — I5030 Unspecified diastolic (congestive) heart failure: Secondary | ICD-10-CM | POA: Diagnosis not present

## 2017-10-12 DIAGNOSIS — E1165 Type 2 diabetes mellitus with hyperglycemia: Secondary | ICD-10-CM | POA: Diagnosis not present

## 2017-10-12 DIAGNOSIS — E1121 Type 2 diabetes mellitus with diabetic nephropathy: Secondary | ICD-10-CM | POA: Diagnosis not present

## 2017-10-12 DIAGNOSIS — E782 Mixed hyperlipidemia: Secondary | ICD-10-CM | POA: Diagnosis not present

## 2017-10-12 DIAGNOSIS — I482 Chronic atrial fibrillation: Secondary | ICD-10-CM | POA: Diagnosis not present

## 2017-10-21 ENCOUNTER — Ambulatory Visit (INDEPENDENT_AMBULATORY_CARE_PROVIDER_SITE_OTHER): Payer: Medicare Other | Admitting: Podiatry

## 2017-10-21 ENCOUNTER — Encounter: Payer: Self-pay | Admitting: Podiatry

## 2017-10-21 DIAGNOSIS — L603 Nail dystrophy: Secondary | ICD-10-CM

## 2017-10-21 MED ORDER — TERBINAFINE HCL 250 MG PO TABS: 250.0000 mg | ORAL_TABLET | Freq: Every day | ORAL | 0 refills | Status: DC

## 2017-10-21 NOTE — Patient Instructions (Signed)
Dr. Hyatt has sent over a refill for Lamisil to your pharmacy today. The instructions on your bottle will say "take 1 tablet daily", however, he would like for you to take one pill every other day. He will follow up with you in 3 months to re-evaluate your toenails. 

## 2017-10-23 NOTE — Progress Notes (Signed)
He presents today after having completed 120 days of Lamisil.  States that they appear to be healing they no longer hurt.  He tolerated his medications well no complications he states.  Objective: Vital signs are stable he is alert and oriented x3.  Denies any changes in his past medical history medications allergy surgeries and social history.  Pulses remain palpable nail plates appear to be clearing from proximal to distal by approximately 50% particularly the hallux nails.  The lesser nails have almost cleared 100%.  Plan: Continue him on Lamisil 250 mg tablets 1 every other day for the next 60 days.  I will follow-up with him in 3 months.

## 2017-10-26 ENCOUNTER — Ambulatory Visit (INDEPENDENT_AMBULATORY_CARE_PROVIDER_SITE_OTHER): Payer: Medicare Other | Admitting: *Deleted

## 2017-10-26 DIAGNOSIS — I495 Sick sinus syndrome: Secondary | ICD-10-CM | POA: Diagnosis not present

## 2017-10-26 NOTE — Progress Notes (Signed)
Remote pacemaker transmission.   

## 2017-10-27 LAB — CUP PACEART REMOTE DEVICE CHECK
Battery Remaining Longevity: 162 mo
Brady Statistic RA Percent Paced: 61 %
Brady Statistic RV Percent Paced: 71 %
Date Time Interrogation Session: 20190219052100
Implantable Lead Implant Date: 20171116
Implantable Lead Implant Date: 20171116
Implantable Lead Location: 753859
Implantable Lead Model: 7741
Implantable Lead Serial Number: 821967
Lead Channel Setting Sensing Sensitivity: 2.5 mV
MDC IDC LEAD LOCATION: 753860
MDC IDC LEAD SERIAL: 743314
MDC IDC MSMT BATTERY REMAINING PERCENTAGE: 100 %
MDC IDC MSMT LEADCHNL RA IMPEDANCE VALUE: 722 Ohm
MDC IDC MSMT LEADCHNL RV IMPEDANCE VALUE: 644 Ohm
MDC IDC PG IMPLANT DT: 20171116
MDC IDC PG SERIAL: 770005
MDC IDC SET LEADCHNL RA PACING AMPLITUDE: 2 V
MDC IDC SET LEADCHNL RV PACING AMPLITUDE: 2.5 V
MDC IDC SET LEADCHNL RV PACING PULSEWIDTH: 0.4 ms

## 2017-10-28 ENCOUNTER — Encounter: Payer: Self-pay | Admitting: Cardiology

## 2017-12-19 ENCOUNTER — Other Ambulatory Visit: Payer: Self-pay | Admitting: Physician Assistant

## 2018-01-17 ENCOUNTER — Other Ambulatory Visit: Payer: Self-pay | Admitting: Internal Medicine

## 2018-01-18 ENCOUNTER — Encounter: Payer: Self-pay | Admitting: Podiatry

## 2018-01-18 ENCOUNTER — Telehealth: Payer: Self-pay | Admitting: *Deleted

## 2018-01-18 ENCOUNTER — Other Ambulatory Visit: Payer: Self-pay

## 2018-01-18 ENCOUNTER — Ambulatory Visit (INDEPENDENT_AMBULATORY_CARE_PROVIDER_SITE_OTHER): Payer: Medicare Other | Admitting: Podiatry

## 2018-01-18 DIAGNOSIS — Z79899 Other long term (current) drug therapy: Secondary | ICD-10-CM | POA: Diagnosis not present

## 2018-01-18 DIAGNOSIS — L603 Nail dystrophy: Secondary | ICD-10-CM

## 2018-01-18 DIAGNOSIS — M79604 Pain in right leg: Secondary | ICD-10-CM

## 2018-01-18 MED ORDER — TERBINAFINE HCL 250 MG PO TABS: 250.0000 mg | ORAL_TABLET | Freq: Every day | ORAL | 0 refills | Status: DC

## 2018-01-18 NOTE — Telephone Encounter (Signed)
-----   Message from Rip Harbour, [facility] sent at 01/18/2018 10:52 AM EDT ----- Regarding: Ortho referral  [facility] - patient is having posterior knee pain right

## 2018-01-18 NOTE — Progress Notes (Signed)
He presents today for follow-up of his Lamisil therapy.  States that he seems to be doing pretty well.  He states that the toenails are looking better and has had no problems taking the medicine.  Objective: Vital signs are stable he is alert and oriented x3.  Pulses are palpable.  There is no erythema edema cellulitis drainage or odor.  Nail plates are grown out by approximately 60%.  Assessment: Onychomycosis.  Plan: Continue current therapy with Lamisil 1 tablet every other day for the next 2 months I will follow-up with him at that time.

## 2018-01-18 NOTE — Patient Instructions (Signed)
Dr. Hyatt has sent over a refill for Lamisil to your pharmacy today. The instructions on your bottle will say "take 1 tablet daily", however, he would like for you to take one pill every other day. He will follow up with you in 3 months to re-evaluate your toenails. 

## 2018-01-18 NOTE — Telephone Encounter (Signed)
Referral, clinicals and demographics faxed to [facility].

## 2018-01-25 ENCOUNTER — Ambulatory Visit (INDEPENDENT_AMBULATORY_CARE_PROVIDER_SITE_OTHER): Payer: Medicare Other | Admitting: *Deleted

## 2018-01-25 DIAGNOSIS — R001 Bradycardia, unspecified: Secondary | ICD-10-CM | POA: Diagnosis not present

## 2018-01-25 DIAGNOSIS — I4892 Unspecified atrial flutter: Secondary | ICD-10-CM

## 2018-01-25 NOTE — Progress Notes (Signed)
Remote pacemaker transmission.   

## 2018-01-27 LAB — CUP PACEART REMOTE DEVICE CHECK
Brady Statistic RA Percent Paced: 62 %
Brady Statistic RV Percent Paced: 68 %
Date Time Interrogation Session: 20190521042100
Implantable Lead Location: 753859
Implantable Lead Serial Number: 743314
Implantable Pulse Generator Implant Date: 20171116
Lead Channel Impedance Value: 608 Ohm
Lead Channel Setting Pacing Amplitude: 2 V
Lead Channel Setting Pacing Pulse Width: 0.4 ms
MDC IDC LEAD IMPLANT DT: 20171116
MDC IDC LEAD IMPLANT DT: 20171116
MDC IDC LEAD LOCATION: 753860
MDC IDC LEAD SERIAL: 821967
MDC IDC MSMT BATTERY REMAINING LONGEVITY: 168 mo
MDC IDC MSMT BATTERY REMAINING PERCENTAGE: 100 %
MDC IDC MSMT LEADCHNL RA IMPEDANCE VALUE: 723 Ohm
MDC IDC SET LEADCHNL RV PACING AMPLITUDE: 2.5 V
MDC IDC SET LEADCHNL RV SENSING SENSITIVITY: 2.5 mV
Pulse Gen Serial Number: 770005

## 2018-03-07 DIAGNOSIS — H25099 Other age-related incipient cataract, unspecified eye: Secondary | ICD-10-CM | POA: Diagnosis not present

## 2018-03-07 DIAGNOSIS — H5203 Hypermetropia, bilateral: Secondary | ICD-10-CM | POA: Diagnosis not present

## 2018-03-07 DIAGNOSIS — Z7984 Long term (current) use of oral hypoglycemic drugs: Secondary | ICD-10-CM | POA: Diagnosis not present

## 2018-03-07 DIAGNOSIS — H11159 Pinguecula, unspecified eye: Secondary | ICD-10-CM | POA: Diagnosis not present

## 2018-03-07 DIAGNOSIS — I10 Essential (primary) hypertension: Secondary | ICD-10-CM | POA: Diagnosis not present

## 2018-03-07 DIAGNOSIS — H524 Presbyopia: Secondary | ICD-10-CM | POA: Diagnosis not present

## 2018-03-07 DIAGNOSIS — E119 Type 2 diabetes mellitus without complications: Secondary | ICD-10-CM | POA: Diagnosis not present

## 2018-03-07 DIAGNOSIS — H52223 Regular astigmatism, bilateral: Secondary | ICD-10-CM | POA: Diagnosis not present

## 2018-03-07 DIAGNOSIS — H01111 Allergic dermatitis of right upper eyelid: Secondary | ICD-10-CM | POA: Diagnosis not present

## 2018-04-06 DIAGNOSIS — I10 Essential (primary) hypertension: Secondary | ICD-10-CM | POA: Diagnosis not present

## 2018-04-06 DIAGNOSIS — E782 Mixed hyperlipidemia: Secondary | ICD-10-CM | POA: Diagnosis not present

## 2018-04-06 DIAGNOSIS — N390 Urinary tract infection, site not specified: Secondary | ICD-10-CM | POA: Diagnosis not present

## 2018-04-06 DIAGNOSIS — Z23 Encounter for immunization: Secondary | ICD-10-CM | POA: Diagnosis not present

## 2018-04-06 DIAGNOSIS — E1165 Type 2 diabetes mellitus with hyperglycemia: Secondary | ICD-10-CM | POA: Diagnosis not present

## 2018-04-06 DIAGNOSIS — E1121 Type 2 diabetes mellitus with diabetic nephropathy: Secondary | ICD-10-CM | POA: Diagnosis not present

## 2018-04-06 DIAGNOSIS — Z0000 Encounter for general adult medical examination without abnormal findings: Secondary | ICD-10-CM | POA: Diagnosis not present

## 2018-04-06 DIAGNOSIS — I5030 Unspecified diastolic (congestive) heart failure: Secondary | ICD-10-CM | POA: Diagnosis not present

## 2018-04-06 DIAGNOSIS — I482 Chronic atrial fibrillation: Secondary | ICD-10-CM | POA: Diagnosis not present

## 2018-04-12 DIAGNOSIS — E1121 Type 2 diabetes mellitus with diabetic nephropathy: Secondary | ICD-10-CM | POA: Diagnosis not present

## 2018-04-12 DIAGNOSIS — E782 Mixed hyperlipidemia: Secondary | ICD-10-CM | POA: Diagnosis not present

## 2018-04-12 DIAGNOSIS — I482 Chronic atrial fibrillation: Secondary | ICD-10-CM | POA: Diagnosis not present

## 2018-04-12 DIAGNOSIS — E1165 Type 2 diabetes mellitus with hyperglycemia: Secondary | ICD-10-CM | POA: Diagnosis not present

## 2018-04-12 DIAGNOSIS — I10 Essential (primary) hypertension: Secondary | ICD-10-CM | POA: Diagnosis not present

## 2018-04-12 DIAGNOSIS — N401 Enlarged prostate with lower urinary tract symptoms: Secondary | ICD-10-CM | POA: Diagnosis not present

## 2018-04-12 DIAGNOSIS — I5030 Unspecified diastolic (congestive) heart failure: Secondary | ICD-10-CM | POA: Diagnosis not present

## 2018-04-12 DIAGNOSIS — K210 Gastro-esophageal reflux disease with esophagitis: Secondary | ICD-10-CM | POA: Diagnosis not present

## 2018-04-19 ENCOUNTER — Ambulatory Visit: Payer: Medicare Other | Admitting: Podiatry

## 2018-04-21 ENCOUNTER — Encounter: Payer: Self-pay | Admitting: Podiatry

## 2018-04-21 ENCOUNTER — Ambulatory Visit (INDEPENDENT_AMBULATORY_CARE_PROVIDER_SITE_OTHER): Payer: Medicare Other | Admitting: Podiatry

## 2018-04-21 DIAGNOSIS — L603 Nail dystrophy: Secondary | ICD-10-CM | POA: Diagnosis not present

## 2018-04-21 MED ORDER — TERBINAFINE HCL 250 MG PO TABS: 250.0000 mg | ORAL_TABLET | Freq: Every day | ORAL | 0 refills | Status: DC

## 2018-04-21 NOTE — Patient Instructions (Signed)
Dr. Hyatt has sent over a refill for Lamisil to your pharmacy today. The instructions on your bottle will say "take 1 tablet daily", however, he would like for you to take one pill every other day. He will follow up with you in 3 months to re-evaluate your toenails. 

## 2018-04-23 NOTE — Progress Notes (Signed)
He presents today for follow-up of fungus he says I think they are looking much better.  Objective: Vital signs are stable he is alert and oriented x3.  Toenails have grown out approximately 80%.  He denied any problems taking medication.  Assessment: Onychomycosis long-term therapy with Lamisil.  Plan: Started him on another dose of Lamisil every other day and I will follow-up with him in 3 months.  60 tablets will be dispensed.

## 2018-04-26 ENCOUNTER — Ambulatory Visit (INDEPENDENT_AMBULATORY_CARE_PROVIDER_SITE_OTHER): Payer: Medicare Other | Admitting: *Deleted

## 2018-04-26 DIAGNOSIS — I495 Sick sinus syndrome: Secondary | ICD-10-CM

## 2018-04-26 DIAGNOSIS — R001 Bradycardia, unspecified: Secondary | ICD-10-CM | POA: Diagnosis not present

## 2018-04-26 NOTE — Progress Notes (Signed)
Remote pacemaker transmission.   

## 2018-05-31 LAB — CUP PACEART REMOTE DEVICE CHECK
Date Time Interrogation Session: 20190924070331
Implantable Lead Implant Date: 20171116
Implantable Lead Location: 753859
Implantable Lead Location: 753860
Implantable Lead Serial Number: 821967
MDC IDC LEAD IMPLANT DT: 20171116
MDC IDC LEAD SERIAL: 743314
MDC IDC PG IMPLANT DT: 20171116
MDC IDC PG SERIAL: 770005

## 2018-07-01 ENCOUNTER — Telehealth: Payer: Self-pay

## 2018-07-01 NOTE — Telephone Encounter (Signed)
Pt is wanting a cell adapter so he do not have to use a land line. I informed him that I would have to get in touch with [place] representative to contact him and get him that cell adapter.

## 2018-07-21 ENCOUNTER — Encounter: Payer: Self-pay | Admitting: Podiatry

## 2018-07-21 ENCOUNTER — Ambulatory Visit (INDEPENDENT_AMBULATORY_CARE_PROVIDER_SITE_OTHER): Payer: Medicare Other | Admitting: Podiatry

## 2018-07-21 DIAGNOSIS — L603 Nail dystrophy: Secondary | ICD-10-CM | POA: Diagnosis not present

## 2018-07-21 MED ORDER — TERBINAFINE HCL 250 MG PO TABS: 250.0000 mg | ORAL_TABLET | Freq: Every day | ORAL | 0 refills | Status: DC

## 2018-07-24 NOTE — Progress Notes (Signed)
He presents today for follow-up of nail fungus.  He states that some of them started to clear up and looking a whole lot better.  He denies fever chills nausea vomiting muscle aches pains any problems taking the medication.  No rashes no itching.  Objective: Toenails are clear and very nicely his lesser nails have cleared almost 100% the hallux nail still demonstrated about 75/85% clearing of the distal aspect of the nail may never clear 100%.  Assessment: Slowly resolving onychomycosis.  Plan: I will follow-up with him in about 3 months to reevaluate.  Put him on another dose of Lamisil 30 tablets 1 every other day

## 2018-07-26 ENCOUNTER — Telehealth: Payer: Self-pay

## 2018-07-26 NOTE — Telephone Encounter (Signed)
Spoke with pt and reminded pt of remote transmission that is due today. Pt verbalized understanding.   

## 2018-07-28 ENCOUNTER — Encounter: Payer: Self-pay | Admitting: Cardiology

## 2018-09-14 ENCOUNTER — Encounter: Payer: Medicare Other | Admitting: Internal Medicine

## 2018-09-17 ENCOUNTER — Other Ambulatory Visit: Payer: Self-pay | Admitting: Physician Assistant

## 2018-09-20 DIAGNOSIS — E1165 Type 2 diabetes mellitus with hyperglycemia: Secondary | ICD-10-CM | POA: Diagnosis not present

## 2018-09-20 DIAGNOSIS — I10 Essential (primary) hypertension: Secondary | ICD-10-CM | POA: Diagnosis not present

## 2018-09-20 DIAGNOSIS — E782 Mixed hyperlipidemia: Secondary | ICD-10-CM | POA: Diagnosis not present

## 2018-09-20 DIAGNOSIS — E1121 Type 2 diabetes mellitus with diabetic nephropathy: Secondary | ICD-10-CM | POA: Diagnosis not present

## 2018-10-10 DIAGNOSIS — E782 Mixed hyperlipidemia: Secondary | ICD-10-CM | POA: Diagnosis not present

## 2018-10-10 DIAGNOSIS — E1121 Type 2 diabetes mellitus with diabetic nephropathy: Secondary | ICD-10-CM | POA: Diagnosis not present

## 2018-10-10 DIAGNOSIS — I10 Essential (primary) hypertension: Secondary | ICD-10-CM | POA: Diagnosis not present

## 2018-10-10 DIAGNOSIS — E1165 Type 2 diabetes mellitus with hyperglycemia: Secondary | ICD-10-CM | POA: Diagnosis not present

## 2018-10-10 DIAGNOSIS — Z23 Encounter for immunization: Secondary | ICD-10-CM | POA: Diagnosis not present

## 2018-10-20 ENCOUNTER — Encounter: Payer: Self-pay | Admitting: Podiatry

## 2018-10-20 ENCOUNTER — Ambulatory Visit (INDEPENDENT_AMBULATORY_CARE_PROVIDER_SITE_OTHER): Payer: Medicare Other | Admitting: Podiatry

## 2018-10-20 DIAGNOSIS — L603 Nail dystrophy: Secondary | ICD-10-CM | POA: Diagnosis not present

## 2018-10-20 MED ORDER — TERBINAFINE HCL 250 MG PO TABS: 250.0000 mg | ORAL_TABLET | Freq: Every day | ORAL | 0 refills | Status: DC

## 2018-10-20 NOTE — Progress Notes (Signed)
He presents today for follow-up of his onychomycosis he is completed 120 days and 4 every other day cycles he states the nails are clearing but there still seems to be some fungus or something underneath the toenails.  Objective: Signs are stable he is alert and oriented x3.  Pulses are palpable.  He has distal clavi to the second toes bilaterally with hammertoe deformities.  These were dystrophic toenails and will not improve.  However the hallux nails are improving drastically and look much much better.  They are 99% grown out.  Assessment: Well-healing onychomycosis with use of Lamisil therapy.  Plan: Started him on Lamisil every other day for the next 2 months this will be his last dose.

## 2018-10-26 ENCOUNTER — Encounter: Payer: Medicare Other | Admitting: Internal Medicine

## 2018-10-28 ENCOUNTER — Encounter: Payer: Self-pay | Admitting: Internal Medicine

## 2018-10-28 ENCOUNTER — Ambulatory Visit (INDEPENDENT_AMBULATORY_CARE_PROVIDER_SITE_OTHER): Payer: Medicare Other

## 2018-10-28 ENCOUNTER — Ambulatory Visit (INDEPENDENT_AMBULATORY_CARE_PROVIDER_SITE_OTHER): Payer: Medicare Other | Admitting: Internal Medicine

## 2018-10-28 VITALS — BP 124/72 | HR 50 | Ht 71.0 in | Wt 253.0 lb

## 2018-10-28 DIAGNOSIS — I495 Sick sinus syndrome: Secondary | ICD-10-CM | POA: Diagnosis not present

## 2018-10-28 DIAGNOSIS — I10 Essential (primary) hypertension: Secondary | ICD-10-CM | POA: Diagnosis not present

## 2018-10-28 DIAGNOSIS — R001 Bradycardia, unspecified: Secondary | ICD-10-CM | POA: Diagnosis not present

## 2018-10-28 DIAGNOSIS — Z950 Presence of cardiac pacemaker: Secondary | ICD-10-CM | POA: Diagnosis not present

## 2018-10-28 DIAGNOSIS — I4892 Unspecified atrial flutter: Secondary | ICD-10-CM

## 2018-10-28 NOTE — Patient Instructions (Signed)
Medication Instructions:  Your physician recommends that you continue on your current medications as directed. Please refer to the Current Medication list given to you today.  Labwork: None ordered.  Testing/Procedures: None ordered.  Follow-Up: Your physician wants you to follow-up in: one year with Dr. Lovena Le.    You will receive a reminder letter in the mail two months in advance. If you don't receive a letter, please call our office to schedule the follow-up appointment.  Remote monitoring is used to monitor your Pacemaker from home. This monitoring reduces the number of office visits required to check your device to one time per year. It allows [place] to keep an eye on the functioning of your device to ensure it is working properly. You are scheduled for a device check from home on 01/31/2019. You may send your transmission at any time that day. If you have a wireless device, the transmission will be sent automatically. After your physician reviews your transmission, you will receive a postcard with your next transmission date.  Any Other Special Instructions Will Be Listed Below (If Applicable).  If you need a refill on your cardiac medications before your next appointment, please call your pharmacy.

## 2018-10-28 NOTE — Progress Notes (Signed)
HPI Mr. Sirmon returns today for ongoing evaluation and management of his PPM, HTN, dyslipidemia and paroxysmal atrial arrhythmias. He underwent catheter ablation several months ago. He has atrial fib listed as a problem but my review of all of the ECGs in Epic demonstrates no atrial fib. He admits to being sedentary. No Known Allergies   Current Outpatient Medications  Medication Sig Dispense Refill  . atorvastatin (LIPITOR) 20 MG tablet Take 20 mg by mouth at bedtime.  0  . Cholecalciferol (VITAMIN D) 2000 units CAPS Take 2,000 Units by mouth daily as needed (leg problems).    . finasteride (PROSCAR) 5 MG tablet Take 5 mg by mouth daily with breakfast.     . furosemide (LASIX) 40 MG tablet TAKE 1 TABLET BY MOUTH ONCE DAILY 30 tablet 0  . glimepiride (AMARYL) 4 MG tablet Take 4 mg by mouth daily with breakfast.    . lisinopril (PRINIVIL,ZESTRIL) 20 MG tablet Take 20 mg by mouth daily.     . metFORMIN (GLUCOPHAGE-XR) 500 MG 24 hr tablet Take 1,000 mg by mouth 2 (two) times daily.  0  . montelukast (SINGULAIR) 10 MG tablet TK 1 T PO QD IN THE EVE  3  . tamsulosin (FLOMAX) 0.4 MG CAPS capsule Take 0.8 mg by mouth daily.     Marland Kitchen terbinafine (LAMISIL) 250 MG tablet Take 1 tablet (250 mg total) by mouth daily. 30 tablet 0   No current facility-administered medications for this visit.      Past Medical History:  Diagnosis Date  . BPH (benign prostatic hyperplasia)   . Chronic diastolic CHF (congestive heart failure) ([place])   . Demand ischemia ([place])    a. elev trop 07/2016 in setting of demand ischemia from CHF/bradycardia - no sig CAD by cath at that time.  . Diabetes mellitus without complication ([place])   . High cholesterol   . Hypertension   . Junctional bradycardia    a. s/p [employer] Sci ppm 07/2016.  . Mitral regurgitation   . PAF (paroxysmal atrial fibrillation) ([place])   . Renal insufficiency   . Right ventricular dysfunction   . S/P cardiac pacemaker procedure   . Tricuspid  regurgitation     ROS:   All systems reviewed and negative except as noted in the HPI.   Past Surgical History:  Procedure Laterality Date  . A-FLUTTER ABLATION N/A 03/29/2017   Procedure: A-Flutter Ablation;  Surgeon: Evans Lance, MD;  Location: [place] [facility];  Service: Cardiovascular;  Laterality: N/A;  . CARDIAC CATHETERIZATION N/A 07/23/2016   Procedure: Right/Left Heart Cath and Coronary Angiography;  Surgeon: Larey Dresser, MD;  Location: [place] [facility];  Service: Cardiovascular;  Laterality: N/A;  . EP IMPLANTABLE DEVICE N/A 07/23/2016   Procedure: Pacemaker Implant;  Surgeon: Evans Lance, MD;  Location: [place] [facility];  Service: Cardiovascular;  Laterality: N/A;     No family history on file.   Social History   Socioeconomic History  . Marital status: Divorced    Spouse name: Not on file  . Number of children: Not on file  . Years of education: Not on file  . Highest education level: Not on file  Occupational History  . Not on file  Social Needs  . Financial resource strain: Not on file  . Food insecurity:    Worry: Not on file    Inability: Not on file  . Transportation needs:    Medical: Not on file  Non-medical: Not on file  Tobacco Use  . Smoking status: Former Research scientist (life sciences)  . Smokeless tobacco: Never Used  Substance and Sexual Activity  . Alcohol use: No    Comment: 2-3/wk  . Drug use: No  . Sexual activity: Not on file  Lifestyle  . Physical activity:    Days per week: Not on file    Minutes per session: Not on file  . Stress: Not on file  Relationships  . Social connections:    Talks on phone: Not on file    Gets together: Not on file    Attends religious service: Not on file    Active member of club or organization: Not on file    Attends meetings of clubs or organizations: Not on file    Relationship status: Not on file  . Intimate partner violence:    Fear of current or ex partner: Not on file    Emotionally abused: Not  on file    Physically abused: Not on file    Forced sexual activity: Not on file  Other Topics Concern  . Not on file  Social History Narrative  . Not on file     BP 124/72   Pulse (!) 50   Ht 5\' 11"  (1.803 m)   Wt 253 lb (114.8 kg)   BMI 35.29 kg/m   Physical Exam:  Well appearing NAD HEENT: Unremarkable Neck:  No JVD, no thyromegally Lymphatics:  No adenopathy Back:  No CVA tenderness Lungs:  Clear HEART:  Regular rate rhythm, no murmurs, no rubs, no clicks Abd:  soft, positive bowel sounds, no organomegally, no rebound, no guarding Ext:  2 plus pulses, no edema, no cyanosis, no clubbing Skin:  No rashes no nodules Neuro:  CN II through XII intact, motor grossly intact  EKG - nsr with atrial pacing and marked first degree AV block.  DEVICE  Normal device function.  See PaceArt for details.   Assess/Plan: 1. Sinus node dysfunction - he is asymptomatic, s/p PPM insertion. 2. HTN - his blood pressure is well controlled. No change in meds. 3. PPM - his [employer] DDD PM is working normally.  4. Obesity - his weight is down 10 lbs. I have encouraged him to continue to try and lose weight.  Mikle Bosworth.D.

## 2018-10-29 ENCOUNTER — Encounter: Payer: Self-pay | Admitting: Internal Medicine

## 2018-10-31 LAB — CUP PACEART REMOTE DEVICE CHECK
Battery Remaining Percentage: 100 %
Brady Statistic RA Percent Paced: 69 %
Brady Statistic RV Percent Paced: 56 %
Implantable Lead Implant Date: 20171116
Implantable Lead Location: 753859
Implantable Lead Location: 753860
Implantable Lead Model: 7741
Implantable Lead Model: 7742
Implantable Lead Serial Number: 743314
Lead Channel Impedance Value: 598 Ohm
Lead Channel Impedance Value: 705 Ohm
Lead Channel Setting Pacing Amplitude: 2 V
Lead Channel Setting Pacing Pulse Width: 0.4 ms
MDC IDC LEAD IMPLANT DT: 20171116
MDC IDC LEAD SERIAL: 821967
MDC IDC MSMT BATTERY REMAINING LONGEVITY: 156 mo
MDC IDC MSMT LEADCHNL RA PACING THRESHOLD AMPLITUDE: 0.6 V
MDC IDC MSMT LEADCHNL RA PACING THRESHOLD PULSEWIDTH: 0.4 ms
MDC IDC PG IMPLANT DT: 20171116
MDC IDC PG SERIAL: 770005
MDC IDC SESS DTM: 20200221203100
MDC IDC SET LEADCHNL RV PACING AMPLITUDE: 2.5 V
MDC IDC SET LEADCHNL RV SENSING SENSITIVITY: 2.5 mV

## 2018-10-31 LAB — CUP PACEART INCLINIC DEVICE CHECK
Implantable Lead Implant Date: 20171116
Implantable Lead Model: 7742
Implantable Lead Serial Number: 743314
Lead Channel Impedance Value: 697 Ohm
Lead Channel Pacing Threshold Amplitude: 1.1 V
Lead Channel Pacing Threshold Pulse Width: 0.4 ms
Lead Channel Sensing Intrinsic Amplitude: 3.5 mV
Lead Channel Setting Pacing Amplitude: 2 V
Lead Channel Setting Pacing Amplitude: 2.5 V
MDC IDC LEAD IMPLANT DT: 20171116
MDC IDC LEAD LOCATION: 753859
MDC IDC LEAD LOCATION: 753860
MDC IDC LEAD SERIAL: 821967
MDC IDC MSMT LEADCHNL RA PACING THRESHOLD AMPLITUDE: 0.7 V
MDC IDC MSMT LEADCHNL RA PACING THRESHOLD PULSEWIDTH: 0.4 ms
MDC IDC MSMT LEADCHNL RV IMPEDANCE VALUE: 591 Ohm
MDC IDC MSMT LEADCHNL RV SENSING INTR AMPL: 6.8 mV
MDC IDC PG IMPLANT DT: 20171116
MDC IDC SESS DTM: 20200221050000
MDC IDC SET LEADCHNL RV PACING PULSEWIDTH: 0.4 ms
MDC IDC SET LEADCHNL RV SENSING SENSITIVITY: 2.5 mV
Pulse Gen Serial Number: 770005

## 2018-11-03 NOTE — Progress Notes (Signed)
Remote pacemaker transmission.   

## 2018-11-04 ENCOUNTER — Encounter: Payer: Self-pay | Admitting: Cardiology

## 2018-11-11 DIAGNOSIS — R3915 Urgency of urination: Secondary | ICD-10-CM | POA: Diagnosis not present

## 2018-11-11 DIAGNOSIS — N401 Enlarged prostate with lower urinary tract symptoms: Secondary | ICD-10-CM | POA: Diagnosis not present

## 2018-11-11 DIAGNOSIS — N528 Other male erectile dysfunction: Secondary | ICD-10-CM | POA: Diagnosis not present

## 2018-12-17 ENCOUNTER — Other Ambulatory Visit: Payer: Self-pay | Admitting: Physician Assistant

## 2019-01-19 ENCOUNTER — Ambulatory Visit (INDEPENDENT_AMBULATORY_CARE_PROVIDER_SITE_OTHER): Payer: Medicare Other | Admitting: Podiatry

## 2019-01-19 ENCOUNTER — Other Ambulatory Visit: Payer: Self-pay

## 2019-01-19 ENCOUNTER — Encounter: Payer: Self-pay | Admitting: Podiatry

## 2019-01-19 DIAGNOSIS — B351 Tinea unguium: Secondary | ICD-10-CM

## 2019-01-19 DIAGNOSIS — M79676 Pain in unspecified toe(s): Secondary | ICD-10-CM | POA: Diagnosis not present

## 2019-01-19 MED ORDER — TERBINAFINE HCL 250 MG PO TABS: 250.0000 mg | ORAL_TABLET | Freq: Every day | ORAL | 0 refills | Status: AC

## 2019-01-19 NOTE — Progress Notes (Signed)
He presents today for follow-up of his nail fungus he is completed his fifth round of every other day dosing of Lamisil states the toenails are looking better but the end of the big toes are quite tender.  Objective: Vital signs are stable he is alert and oriented x3.  Toenails are nearly 90 to 95% grown out though the nails are thick and painful he still has strong palpable pulses with no open lesions or wounds.  Assessment: Pain limb secondary to onychomycosis.  Plan: Debridement of toenails 1 through 5 bilateral Monday 1 more dose of every other day dosing and I will follow-up with him in 3 months and that will be the end of the treatment for Lamisil.

## 2019-01-31 ENCOUNTER — Ambulatory Visit (INDEPENDENT_AMBULATORY_CARE_PROVIDER_SITE_OTHER): Payer: Medicare Other | Admitting: *Deleted

## 2019-01-31 DIAGNOSIS — I495 Sick sinus syndrome: Secondary | ICD-10-CM | POA: Diagnosis not present

## 2019-01-31 DIAGNOSIS — R001 Bradycardia, unspecified: Secondary | ICD-10-CM

## 2019-02-01 LAB — CUP PACEART REMOTE DEVICE CHECK
Battery Remaining Longevity: 156 mo
Battery Remaining Percentage: 100 %
Brady Statistic RA Percent Paced: 68 %
Brady Statistic RV Percent Paced: 67 %
Date Time Interrogation Session: 20200526045900
Implantable Lead Implant Date: 20171116
Implantable Lead Implant Date: 20171116
Implantable Lead Location: 753859
Implantable Lead Location: 753860
Implantable Lead Model: 7741
Implantable Lead Model: 7742
Implantable Lead Serial Number: 743314
Implantable Lead Serial Number: 821967
Implantable Pulse Generator Implant Date: 20171116
Lead Channel Impedance Value: 581 Ohm
Lead Channel Impedance Value: 700 Ohm
Lead Channel Setting Pacing Amplitude: 2 V
Lead Channel Setting Pacing Amplitude: 2.5 V
Lead Channel Setting Pacing Pulse Width: 0.4 ms
Lead Channel Setting Sensing Sensitivity: 2.5 mV
Pulse Gen Serial Number: 770005

## 2019-02-10 NOTE — Progress Notes (Signed)
Remote pacemaker transmission.   

## 2019-05-02 ENCOUNTER — Ambulatory Visit: Payer: Medicare Other | Admitting: Podiatry

## 2019-05-03 ENCOUNTER — Ambulatory Visit (INDEPENDENT_AMBULATORY_CARE_PROVIDER_SITE_OTHER): Payer: Medicare Other | Admitting: *Deleted

## 2019-05-03 ENCOUNTER — Other Ambulatory Visit: Payer: Self-pay | Admitting: Podiatry

## 2019-05-03 DIAGNOSIS — I495 Sick sinus syndrome: Secondary | ICD-10-CM

## 2019-05-03 LAB — CUP PACEART REMOTE DEVICE CHECK
Battery Remaining Longevity: 150 mo
Battery Remaining Percentage: 100 %
Brady Statistic RA Percent Paced: 73 %
Brady Statistic RV Percent Paced: 71 %
Date Time Interrogation Session: 20200825043400
Implantable Lead Implant Date: 20171116
Implantable Lead Implant Date: 20171116
Implantable Lead Location: 753859
Implantable Lead Location: 753860
Implantable Lead Model: 7741
Implantable Lead Model: 7742
Implantable Lead Serial Number: 743314
Implantable Lead Serial Number: 821967
Implantable Pulse Generator Implant Date: 20171116
Lead Channel Impedance Value: 593 Ohm
Lead Channel Impedance Value: 671 Ohm
Lead Channel Setting Pacing Amplitude: 2 V
Lead Channel Setting Pacing Amplitude: 2.5 V
Lead Channel Setting Pacing Pulse Width: 0.4 ms
Lead Channel Setting Sensing Sensitivity: 2.5 mV
Pulse Gen Serial Number: 770005

## 2019-05-12 ENCOUNTER — Encounter: Payer: Self-pay | Admitting: Cardiology

## 2019-05-12 NOTE — Progress Notes (Signed)
Remote pacemaker transmission.   

## 2019-06-23 DIAGNOSIS — Z0000 Encounter for general adult medical examination without abnormal findings: Secondary | ICD-10-CM | POA: Diagnosis not present

## 2019-06-23 DIAGNOSIS — E1165 Type 2 diabetes mellitus with hyperglycemia: Secondary | ICD-10-CM | POA: Diagnosis not present

## 2019-06-23 DIAGNOSIS — Z23 Encounter for immunization: Secondary | ICD-10-CM | POA: Diagnosis not present

## 2019-06-23 DIAGNOSIS — I10 Essential (primary) hypertension: Secondary | ICD-10-CM | POA: Diagnosis not present

## 2019-06-23 DIAGNOSIS — E782 Mixed hyperlipidemia: Secondary | ICD-10-CM | POA: Diagnosis not present

## 2019-06-23 DIAGNOSIS — Z7189 Other specified counseling: Secondary | ICD-10-CM | POA: Diagnosis not present

## 2019-07-04 DIAGNOSIS — I10 Essential (primary) hypertension: Secondary | ICD-10-CM | POA: Diagnosis not present

## 2019-07-04 DIAGNOSIS — E1165 Type 2 diabetes mellitus with hyperglycemia: Secondary | ICD-10-CM | POA: Diagnosis not present

## 2019-07-04 DIAGNOSIS — Z7189 Other specified counseling: Secondary | ICD-10-CM | POA: Diagnosis not present

## 2019-07-04 DIAGNOSIS — E782 Mixed hyperlipidemia: Secondary | ICD-10-CM | POA: Diagnosis not present

## 2019-07-04 DIAGNOSIS — E1121 Type 2 diabetes mellitus with diabetic nephropathy: Secondary | ICD-10-CM | POA: Diagnosis not present

## 2019-07-04 DIAGNOSIS — I4811 Longstanding persistent atrial fibrillation: Secondary | ICD-10-CM | POA: Diagnosis not present

## 2019-07-04 DIAGNOSIS — I5030 Unspecified diastolic (congestive) heart failure: Secondary | ICD-10-CM | POA: Diagnosis not present

## 2019-08-01 LAB — CUP PACEART REMOTE DEVICE CHECK
Battery Remaining Longevity: 144 mo
Battery Remaining Percentage: 100 %
Brady Statistic RA Percent Paced: 72 %
Brady Statistic RV Percent Paced: 72 %
Date Time Interrogation Session: 20201124003500
Implantable Lead Implant Date: 20171116
Implantable Lead Implant Date: 20171116
Implantable Lead Location: 753859
Implantable Lead Location: 753860
Implantable Lead Model: 7741
Implantable Lead Model: 7742
Implantable Lead Serial Number: 743314
Implantable Lead Serial Number: 821967
Implantable Pulse Generator Implant Date: 20171116
Lead Channel Impedance Value: 592 Ohm
Lead Channel Impedance Value: 687 Ohm
Lead Channel Setting Pacing Amplitude: 2 V
Lead Channel Setting Pacing Amplitude: 2.5 V
Lead Channel Setting Pacing Pulse Width: 0.4 ms
Lead Channel Setting Sensing Sensitivity: 2.5 mV
Pulse Gen Serial Number: 770005

## 2019-08-02 ENCOUNTER — Ambulatory Visit (INDEPENDENT_AMBULATORY_CARE_PROVIDER_SITE_OTHER): Payer: Medicare Other | Admitting: *Deleted

## 2019-08-02 DIAGNOSIS — R001 Bradycardia, unspecified: Secondary | ICD-10-CM

## 2019-08-29 NOTE — Progress Notes (Signed)
PPM remote 

## 2019-10-16 ENCOUNTER — Ambulatory Visit: Payer: Medicare Other | Attending: Internal Medicine

## 2019-10-16 DIAGNOSIS — Z23 Encounter for immunization: Secondary | ICD-10-CM | POA: Insufficient documentation

## 2019-10-16 NOTE — Progress Notes (Signed)
   Covid-19 Vaccination Clinic  Name:  Nicholas Harris    MRN: [id number] DOB: 12/22/1940  10/16/2019  [name] was observed post Covid-19 immunization for 15 minutes without incidence. He was provided with Vaccine Information Sheet and instruction to access the V-Safe system.   [name] was instructed to call 911 with any severe reactions post vaccine: Marland Kitchen Difficulty breathing  . Swelling of your face and throat  . A fast heartbeat  . A bad rash all over your body  . Dizziness and weakness    Immunizations Administered    Name Date Dose VIS Date Route   Pfizer COVID-19 Vaccine 10/16/2019  4:45 PM 0.3 mL 08/18/2019 Intramuscular   Manufacturer: [place]   Lot: [id number]   Harris: [id number]

## 2019-11-01 ENCOUNTER — Ambulatory Visit (INDEPENDENT_AMBULATORY_CARE_PROVIDER_SITE_OTHER): Payer: Medicare Other | Admitting: *Deleted

## 2019-11-01 DIAGNOSIS — R001 Bradycardia, unspecified: Secondary | ICD-10-CM | POA: Diagnosis not present

## 2019-11-01 LAB — CUP PACEART REMOTE DEVICE CHECK
Battery Remaining Longevity: 132 mo
Battery Remaining Percentage: 100 %
Brady Statistic RA Percent Paced: 65 %
Brady Statistic RV Percent Paced: 76 %
Date Time Interrogation Session: 20210224002200
Implantable Lead Implant Date: 20171116
Implantable Lead Implant Date: 20171116
Implantable Lead Location: 753859
Implantable Lead Location: 753860
Implantable Lead Model: 7741
Implantable Lead Model: 7742
Implantable Lead Serial Number: 743314
Implantable Lead Serial Number: 821967
Implantable Pulse Generator Implant Date: 20171116
Lead Channel Impedance Value: 590 Ohm
Lead Channel Impedance Value: 682 Ohm
Lead Channel Setting Pacing Amplitude: 2 V
Lead Channel Setting Pacing Amplitude: 2.5 V
Lead Channel Setting Pacing Pulse Width: 0.4 ms
Lead Channel Setting Sensing Sensitivity: 2.5 mV
Pulse Gen Serial Number: 770005

## 2019-11-02 NOTE — Progress Notes (Signed)
PPM Remote  

## 2019-11-10 ENCOUNTER — Ambulatory Visit: Payer: Medicare Other | Attending: Internal Medicine

## 2019-11-10 DIAGNOSIS — Z23 Encounter for immunization: Secondary | ICD-10-CM | POA: Insufficient documentation

## 2019-11-10 NOTE — Progress Notes (Signed)
   Covid-19 Vaccination Clinic  Name:  Nicholas Harris    MRN: [id number] DOB: 1941-05-06  11/10/2019  [name] was observed post Covid-19 immunization for 15 minutes without incident. He was provided with Vaccine Information Sheet and instruction to access the V-Safe system.   [name] was instructed to call 911 with any severe reactions post vaccine: Marland Kitchen Difficulty breathing  . Swelling of face and throat  . A fast heartbeat  . A bad rash all over body  . Dizziness and weakness   Immunizations Administered    Name Date Dose VIS Date Route   Pfizer COVID-19 Vaccine 11/10/2019  4:16 PM 0.3 mL 08/18/2019 Intramuscular   Manufacturer: [place]   Lot: [id number]   [place]: [id number]

## 2019-11-21 ENCOUNTER — Encounter: Payer: Medicare Other | Admitting: Internal Medicine

## 2019-12-11 DIAGNOSIS — N401 Enlarged prostate with lower urinary tract symptoms: Secondary | ICD-10-CM | POA: Diagnosis not present

## 2019-12-11 DIAGNOSIS — R351 Nocturia: Secondary | ICD-10-CM | POA: Diagnosis not present

## 2019-12-22 ENCOUNTER — Encounter: Payer: Self-pay | Admitting: Internal Medicine

## 2019-12-22 ENCOUNTER — Ambulatory Visit (INDEPENDENT_AMBULATORY_CARE_PROVIDER_SITE_OTHER): Payer: Medicare Other | Admitting: Internal Medicine

## 2019-12-22 ENCOUNTER — Other Ambulatory Visit: Payer: Self-pay

## 2019-12-22 VITALS — BP 140/76 | HR 50 | Ht 71.0 in | Wt 253.4 lb

## 2019-12-22 DIAGNOSIS — Z950 Presence of cardiac pacemaker: Secondary | ICD-10-CM | POA: Diagnosis not present

## 2019-12-22 DIAGNOSIS — R001 Bradycardia, unspecified: Secondary | ICD-10-CM

## 2019-12-22 DIAGNOSIS — I4892 Unspecified atrial flutter: Secondary | ICD-10-CM

## 2019-12-22 NOTE — Progress Notes (Signed)
HPI [name] returns today for ongoing evaluation and management of his PPM, HTN, dyslipidemia and paroxysmal atrial arrhythmias. He underwent catheter ablation over a year ago. He has done well. He is back working as a Designer, multimedia on a school bus. He admits to being sedentary and has gained some weight. No Known Allergies   Current Outpatient Medications  Medication Sig Dispense Refill  . atorvastatin (LIPITOR) 20 MG tablet Take 20 mg by mouth at bedtime.  0  . celecoxib (CELEBREX) 200 MG capsule TK 1 C PO QD PRF ARTHRITIS    . Cholecalciferol (VITAMIN D) 2000 units CAPS Take 2,000 Units by mouth daily as needed (leg problems).    . finasteride (PROSCAR) 5 MG tablet Take 5 mg by mouth daily with breakfast.     . furosemide (LASIX) 40 MG tablet TAKE 1 TABLET BY MOUTH EVERY DAY 90 tablet 2  . glimepiride (AMARYL) 4 MG tablet Take 4 mg by mouth daily with breakfast.    . lisinopril (PRINIVIL,ZESTRIL) 20 MG tablet Take 20 mg by mouth daily.     . metFORMIN (GLUCOPHAGE-XR) 500 MG 24 hr tablet Take 1,000 mg by mouth 2 (two) times daily.  0  . montelukast (SINGULAIR) 10 MG tablet TK 1 T PO QD IN THE EVE  3  . tamsulosin (FLOMAX) 0.4 MG CAPS capsule Take 0.8 mg by mouth daily.     Marland Kitchen terbinafine (LAMISIL) 250 MG tablet Take 1 tablet (250 mg total) by mouth daily. 30 tablet 0   No current facility-administered medications for this visit.     Past Medical History:  Diagnosis Date  . BPH (benign prostatic hyperplasia)   . Chronic diastolic CHF (congestive heart failure) ([place])   . Demand ischemia ([place])    a. elev trop 07/2016 in setting of demand ischemia from CHF/bradycardia - no sig CAD by cath at that time.  . Diabetes mellitus without complication ([place])   . High cholesterol   . Hypertension   . Junctional bradycardia    a. s/p [place] Sci ppm 07/2016.  . Mitral regurgitation   . PAF (paroxysmal atrial fibrillation) ([place])   . Renal insufficiency   . Right ventricular  dysfunction   . S/P cardiac pacemaker procedure   . Tricuspid regurgitation     ROS:   All systems reviewed and negative except as noted in the HPI.   Past Surgical History:  Procedure Laterality Date  . A-FLUTTER ABLATION N/A 03/29/2017   Procedure: A-Flutter Ablation;  Surgeon: Evans Lance, MD;  Location: [facility];  Service: Cardiovascular;  Laterality: N/A;  . CARDIAC CATHETERIZATION N/A 07/23/2016   Procedure: Right/Left Heart Cath and Coronary Angiography;  Surgeon: Larey Dresser, MD;  Location: [facility];  Service: Cardiovascular;  Laterality: N/A;  . EP IMPLANTABLE DEVICE N/A 07/23/2016   Procedure: Pacemaker Implant;  Surgeon: Evans Lance, MD;  Location: [facility];  Service: Cardiovascular;  Laterality: N/A;     History reviewed. No pertinent family history.   Social History   Socioeconomic History  . Marital status: Divorced    Spouse name: Not on file  . Number of children: Not on file  . Years of education: Not on file  . Highest education level: Not on file  Occupational History  . Not on file  Tobacco Use  . Smoking status: Former Research scientist (life sciences)  . Smokeless tobacco: Never Used  Substance and Sexual Activity  . Alcohol use: No  Comment: 2-3/wk  . Drug use: No  . Sexual activity: Not on file  Other Topics Concern  . Not on file  Social History Narrative  . Not on file   Social Determinants of Health   Financial Resource Strain:   . Difficulty of Paying Living Expenses:   Food Insecurity:   . Worried About Charity fundraiser in the Last Year:   . Arboriculturist in the Last Year:   Transportation Needs:   . Film/video editor (Medical):   Marland Kitchen Lack of Transportation (Non-Medical):   Physical Activity:   . Days of Exercise per Week:   . Minutes of Exercise per Session:   Stress:   . Feeling of Stress :   Social Connections:   . Frequency of Communication with Friends and Family:   . Frequency of Social  Gatherings with Friends and Family:   . Attends Religious Services:   . Active Member of Clubs or Organizations:   . Attends Archivist Meetings:   Marland Kitchen Marital Status:   Intimate Partner Violence:   . Fear of Current or Ex-Partner:   . Emotionally Abused:   Marland Kitchen Physically Abused:   . Sexually Abused:      BP 140/76   Pulse (!) 50   Ht 5\' 11"  (1.803 m)   Wt 253 lb 6.4 oz (114.9 kg)   SpO2 96%   BMI 35.34 kg/m   Physical Exam:  Well appearing NAD HEENT: Unremarkable Neck:  No JVD, no thyromegally Lymphatics:  No adenopathy Back:  No CVA tenderness Lungs:  Clear with no wheezes HEART:  Regular rate rhythm, no murmurs, no rubs, no clicks Abd:  soft, positive bowel sounds, no organomegally, no rebound, no guarding Ext:  2 plus pulses, no edema, no cyanosis, no clubbing Skin:  No rashes no nodules Neuro:  CN II through XII intact, motor grossly intact  EKG - sinus bradycardia with AV pacing  DEVICE  Normal device function.  See PaceArt for details.   Assess/Plan: 1. Sinus node dysfunction - he is doing well, s/p DDD PPM. 2. Atrial flutter - he is s/p EPS/RFA of atrial flutter and is doing well with no recurrent sustained arrhythmias 3. HTN - his SBP is minimally elevated. He is encouraged to avoid salty foods. 4. Obesity - I encouraged the patient to lose weight.  5. PPM - his [employer] DDD PM is working normally.  Mikle Bosworth.D.

## 2019-12-22 NOTE — Patient Instructions (Signed)
  Medication Instructions:  Your physician recommends that you continue on your current medications as directed. Please refer to the Current Medication list given to you today.  *If you need a refill on your cardiac medications before your next appointment, please call your pharmacy*   Lab Work: none If you have labs (blood work) drawn today and your tests are completely normal, you will receive your results only by: MyChart Message (if you have MyChart) OR A paper copy in the mail If you have any lab test that is abnormal or we need to change your treatment, we will call you to review the results.   Testing/Procedures: none   Follow-Up: At [facility], you and your health needs are our priority.  As part of our continuing mission to provide you with exceptional heart care, we have created designated Provider Care Teams.  These Care Teams include your primary Cardiologist (physician) and Advanced Practice Providers (APPs -  Physician Assistants and Nurse Practitioners) who all work together to provide you with the care you need, when you need it.  We recommend signing up for the patient portal called "MyChart".  Sign up information is provided on this After Visit Summary.  MyChart is used to connect with patients for Virtual Visits (Telemedicine).  Patients are able to view lab/test results, encounter notes, upcoming appointments, etc.  Non-urgent messages can be sent to your provider as well.   To learn more about what you can do with MyChart, go to [email or web address].    Your next appointment:   1 year(s)  The format for your next appointment:   In Person  Provider:   Gregg Taylor, MD   Other Instructions   

## 2019-12-24 LAB — CUP PACEART REMOTE DEVICE CHECK
Battery Remaining Longevity: 132 mo
Battery Remaining Percentage: 100 %
Brady Statistic RA Percent Paced: 66 %
Brady Statistic RV Percent Paced: 93 %
Date Time Interrogation Session: 20210417142800
Implantable Lead Implant Date: 20171116
Implantable Lead Implant Date: 20171116
Implantable Lead Location: 753859
Implantable Lead Location: 753860
Implantable Lead Model: 7741
Implantable Lead Model: 7742
Implantable Lead Serial Number: 743314
Implantable Lead Serial Number: 821967
Implantable Pulse Generator Implant Date: 20171116
Lead Channel Impedance Value: 578 Ohm
Lead Channel Impedance Value: 662 Ohm
Lead Channel Setting Pacing Amplitude: 2 V
Lead Channel Setting Pacing Amplitude: 2.5 V
Lead Channel Setting Pacing Pulse Width: 0.4 ms
Lead Channel Setting Sensing Sensitivity: 2.5 mV
Pulse Gen Serial Number: 770005

## 2019-12-27 DIAGNOSIS — E1165 Type 2 diabetes mellitus with hyperglycemia: Secondary | ICD-10-CM | POA: Diagnosis not present

## 2019-12-27 DIAGNOSIS — I5030 Unspecified diastolic (congestive) heart failure: Secondary | ICD-10-CM | POA: Diagnosis not present

## 2019-12-27 DIAGNOSIS — E1121 Type 2 diabetes mellitus with diabetic nephropathy: Secondary | ICD-10-CM | POA: Diagnosis not present

## 2019-12-27 DIAGNOSIS — E782 Mixed hyperlipidemia: Secondary | ICD-10-CM | POA: Diagnosis not present

## 2019-12-27 LAB — CUP PACEART INCLINIC DEVICE CHECK
Brady Statistic RA Percent Paced: 63 %
Brady Statistic RV Percent Paced: 78 %
Date Time Interrogation Session: 20210416103542
Implantable Lead Implant Date: 20171116
Implantable Lead Implant Date: 20171116
Implantable Lead Location: 753859
Implantable Lead Location: 753860
Implantable Lead Model: 7741
Implantable Lead Model: 7742
Implantable Lead Serial Number: 743314
Implantable Lead Serial Number: 821967
Implantable Pulse Generator Implant Date: 20171116
Lead Channel Impedance Value: 589 Ohm
Lead Channel Impedance Value: 589 Ohm
Lead Channel Impedance Value: 674 Ohm
Lead Channel Impedance Value: 674 Ohm
Lead Channel Pacing Threshold Amplitude: 0.7 V
Lead Channel Pacing Threshold Amplitude: 0.7 V
Lead Channel Pacing Threshold Amplitude: 1 V
Lead Channel Pacing Threshold Amplitude: 1 V
Lead Channel Pacing Threshold Pulse Width: 0.4 ms
Lead Channel Pacing Threshold Pulse Width: 0.4 ms
Lead Channel Pacing Threshold Pulse Width: 0.4 ms
Lead Channel Pacing Threshold Pulse Width: 0.4 ms
Lead Channel Sensing Intrinsic Amplitude: 2.8 mV
Lead Channel Sensing Intrinsic Amplitude: 2.8 mV
Lead Channel Sensing Intrinsic Amplitude: 3.4 mV
Lead Channel Sensing Intrinsic Amplitude: 3.4 mV
Lead Channel Setting Pacing Amplitude: 2 V
Lead Channel Setting Pacing Amplitude: 2.5 V
Lead Channel Setting Pacing Pulse Width: 0.4 ms
Lead Channel Setting Sensing Sensitivity: 2.5 mV
Pulse Gen Serial Number: 770005

## 2020-01-15 ENCOUNTER — Other Ambulatory Visit: Payer: Self-pay | Admitting: Physician Assistant

## 2020-01-31 ENCOUNTER — Ambulatory Visit (INDEPENDENT_AMBULATORY_CARE_PROVIDER_SITE_OTHER): Payer: Medicare Other | Admitting: *Deleted

## 2020-01-31 DIAGNOSIS — R001 Bradycardia, unspecified: Secondary | ICD-10-CM

## 2020-02-01 ENCOUNTER — Telehealth: Payer: Self-pay

## 2020-02-01 NOTE — Telephone Encounter (Signed)
Left message for patient to remind of missed remote transmission.  

## 2020-02-06 ENCOUNTER — Telehealth: Payer: Self-pay

## 2020-02-06 NOTE — Telephone Encounter (Signed)
Pt with history of Afl ablation, no episodes since.  Latitutude alert received Afl episode begining 02/02/20 ~ 1914.  Episode ongoing at time of transmission on 02/04/20 0011.  Pt not on [place] or any other meds that would affect HR.    Spoke with pt, he is currently asymptomatic.  Pt attempted to send manual transmission to determine if out of AF.  Unable to send with existing box, he received a new one in the mail but has not set up yet.  He will set up and send manual transmission.

## 2020-02-06 NOTE — Progress Notes (Signed)
Remote pacemaker transmission.   

## 2020-02-06 NOTE — Telephone Encounter (Signed)
Noted.GT 

## 2020-02-06 NOTE — Telephone Encounter (Signed)
Attempted to call patient to inform he is no long is AF. No answer. Unable to leave VM on mailbox d/t being full.

## 2020-02-06 NOTE — Telephone Encounter (Signed)
Manual transmission received  Presenting rhythm: Ap/VP 50's.

## 2020-03-26 DIAGNOSIS — M7021 Olecranon bursitis, right elbow: Secondary | ICD-10-CM | POA: Diagnosis not present

## 2020-04-12 DIAGNOSIS — M7021 Olecranon bursitis, right elbow: Secondary | ICD-10-CM | POA: Diagnosis not present

## 2020-04-25 ENCOUNTER — Telehealth: Payer: Self-pay | Admitting: *Deleted

## 2020-04-25 NOTE — Telephone Encounter (Signed)
Attempted to reach pt regarding alert received for ongoing AF beginning 04/23/20 at 10:52. V rates controlled. Not currently on [place]. Hx of A-flutter ablation in 03/2017.  No answer at cell number, unable to LM. LMOM at home number requesting call back to DC. Direct number and office hours provided.

## 2020-04-29 NOTE — Telephone Encounter (Signed)
LMOVM at hoe # requesting call back to DC. Direct # and office hours provided.

## 2020-05-01 ENCOUNTER — Ambulatory Visit (INDEPENDENT_AMBULATORY_CARE_PROVIDER_SITE_OTHER): Payer: Medicare Other | Admitting: *Deleted

## 2020-05-01 DIAGNOSIS — R001 Bradycardia, unspecified: Secondary | ICD-10-CM | POA: Diagnosis not present

## 2020-05-03 LAB — CUP PACEART REMOTE DEVICE CHECK
Battery Remaining Longevity: 126 mo
Battery Remaining Percentage: 100 %
Brady Statistic RA Percent Paced: 50 %
Brady Statistic RV Percent Paced: 87 %
Date Time Interrogation Session: 20210826230800
Implantable Lead Implant Date: 20171116
Implantable Lead Implant Date: 20171116
Implantable Lead Location: 753859
Implantable Lead Location: 753860
Implantable Lead Model: 7741
Implantable Lead Model: 7742
Implantable Lead Serial Number: 743314
Implantable Lead Serial Number: 821967
Implantable Pulse Generator Implant Date: 20171116
Lead Channel Impedance Value: 573 Ohm
Lead Channel Impedance Value: 687 Ohm
Lead Channel Setting Pacing Amplitude: 2 V
Lead Channel Setting Pacing Amplitude: 2.5 V
Lead Channel Setting Pacing Pulse Width: 0.4 ms
Lead Channel Setting Sensing Sensitivity: 2.5 mV
Pulse Gen Serial Number: 770005

## 2020-05-06 NOTE — Telephone Encounter (Signed)
Pt returned call. He denies any symptoms with AF or awareness of episodes. 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 Warfarin previously d/c s/p A-flutter ablation. Per Dr. Tanna Furry OV note from 07/2017, consider restarting [place] if AF noted. Advised will call back with any new recommendations from Dr. Lovena Le. Pt in agreement with plan and denies questions at this time.  Routed to Dr. Lovena Le and Sonia Baller, RN, for recommendations.

## 2020-05-06 NOTE — Telephone Encounter (Signed)
Third attempt:  Contacted pt at cell number--straight to VM, VM full. LMOM at home number requesting call back to DC. Direct number and office hours provided.  Recurrent alerts for AF per [facility] Will discuss with pt.

## 2020-05-06 NOTE — Telephone Encounter (Signed)
Pt returning [place] phone call

## 2020-05-07 NOTE — Progress Notes (Signed)
Remote pacemaker transmission.   

## 2020-05-07 NOTE — Telephone Encounter (Signed)
Per Dr. Lovena Le--  1.  Have Pt start on Eliquis 5 mg one tablet by mouth BID  2.  Schedule follow up with Dr. Lovena Le for EKG  Will forward to device clinic and scheduling.

## 2020-05-08 NOTE — Telephone Encounter (Signed)
[facility] requesting call back from patient. Direct DC number provided.

## 2020-05-10 NOTE — Telephone Encounter (Signed)
[facility] requesting call back from patient. Direct DC number provided.

## 2020-05-10 NOTE — Telephone Encounter (Signed)
Attempted call to pt. Received automated message that pt is on another line, able to LM requesting call back from patient. Direct DC number provided.

## 2020-05-14 DIAGNOSIS — E7801 Familial hypercholesterolemia: Secondary | ICD-10-CM | POA: Diagnosis not present

## 2020-05-14 DIAGNOSIS — I259 Chronic ischemic heart disease, unspecified: Secondary | ICD-10-CM | POA: Diagnosis not present

## 2020-05-14 DIAGNOSIS — I10 Essential (primary) hypertension: Secondary | ICD-10-CM | POA: Diagnosis not present

## 2020-05-14 DIAGNOSIS — Z8249 Family history of ischemic heart disease and other diseases of the circulatory system: Secondary | ICD-10-CM | POA: Diagnosis not present

## 2020-05-14 DIAGNOSIS — Z1379 Encounter for other screening for genetic and chromosomal anomalies: Secondary | ICD-10-CM | POA: Diagnosis not present

## 2020-05-14 DIAGNOSIS — I252 Old myocardial infarction: Secondary | ICD-10-CM | POA: Diagnosis not present

## 2020-05-14 DIAGNOSIS — E7849 Other hyperlipidemia: Secondary | ICD-10-CM | POA: Diagnosis not present

## 2020-05-14 DIAGNOSIS — I2510 Atherosclerotic heart disease of native coronary artery without angina pectoris: Secondary | ICD-10-CM | POA: Diagnosis not present

## 2020-05-14 DIAGNOSIS — I4891 Unspecified atrial fibrillation: Secondary | ICD-10-CM | POA: Diagnosis not present

## 2020-05-15 NOTE — Telephone Encounter (Signed)
Due to multiple unsuccessful attempts to reach pt to discuss starting on Eliquis, appointment notes for 05/16/20 appointment with Dr. Lovena Le updated.

## 2020-05-16 ENCOUNTER — Encounter: Payer: Self-pay | Admitting: Internal Medicine

## 2020-05-16 ENCOUNTER — Other Ambulatory Visit: Payer: Self-pay

## 2020-05-16 ENCOUNTER — Ambulatory Visit (INDEPENDENT_AMBULATORY_CARE_PROVIDER_SITE_OTHER): Payer: Medicare Other | Admitting: Internal Medicine

## 2020-05-16 VITALS — BP 126/60 | HR 53 | Ht 71.0 in | Wt 241.8 lb

## 2020-05-16 DIAGNOSIS — I10 Essential (primary) hypertension: Secondary | ICD-10-CM | POA: Diagnosis not present

## 2020-05-16 DIAGNOSIS — Z950 Presence of cardiac pacemaker: Secondary | ICD-10-CM

## 2020-05-16 DIAGNOSIS — I4892 Unspecified atrial flutter: Secondary | ICD-10-CM

## 2020-05-16 DIAGNOSIS — R001 Bradycardia, unspecified: Secondary | ICD-10-CM

## 2020-05-16 LAB — CUP PACEART INCLINIC DEVICE CHECK
Brady Statistic RA Percent Paced: 49 %
Brady Statistic RV Percent Paced: 86 %
Date Time Interrogation Session: 20210909133549
Implantable Lead Implant Date: 20171116
Implantable Lead Implant Date: 20171116
Implantable Lead Location: 753859
Implantable Lead Location: 753860
Implantable Lead Model: 7741
Implantable Lead Model: 7742
Implantable Lead Serial Number: 743314
Implantable Lead Serial Number: 821967
Implantable Pulse Generator Implant Date: 20171116
Lead Channel Impedance Value: 581 Ohm
Lead Channel Impedance Value: 673 Ohm
Lead Channel Pacing Threshold Amplitude: 0.8 V
Lead Channel Pacing Threshold Amplitude: 1 V
Lead Channel Pacing Threshold Pulse Width: 0.4 ms
Lead Channel Pacing Threshold Pulse Width: 0.4 ms
Lead Channel Sensing Intrinsic Amplitude: 3 mV
Lead Channel Sensing Intrinsic Amplitude: 4.8 mV
Lead Channel Setting Pacing Amplitude: 2 V
Lead Channel Setting Pacing Amplitude: 2.5 V
Lead Channel Setting Pacing Pulse Width: 0.4 ms
Lead Channel Setting Sensing Sensitivity: 2.5 mV
Pulse Gen Serial Number: 770005

## 2020-05-16 LAB — BASIC METABOLIC PANEL
BUN/Creatinine Ratio: 8 — ABNORMAL LOW (ref 10–24)
BUN: 11 mg/dL (ref 8–27)
CO2: 21 mmol/L (ref 20–29)
Calcium: 9.5 mg/dL (ref 8.6–10.2)
Chloride: 100 mmol/L (ref 96–106)
Creatinine, Ser: 1.32 mg/dL — ABNORMAL HIGH (ref 0.76–1.27)
GFR calc Af Amer: 58 mL/min/{1.73_m2} — ABNORMAL LOW (ref >59)
GFR calc non Af Amer: 50 mL/min/{1.73_m2} — ABNORMAL LOW (ref >59)
Glucose: 164 mg/dL — ABNORMAL HIGH (ref 65–99)
Potassium: 4.2 mmol/L (ref 3.5–5.2)
Sodium: 137 mmol/L (ref 134–144)

## 2020-05-16 NOTE — Patient Instructions (Addendum)
Medication Instructions:  Your physician has recommended you make the following change in your medication:   1.  Start taking Xarelto  I will call you with the correct dose of Xarelto after I get your lab work back.   Labwork: You will get lab work today:  BMP  Testing/Procedures: None ordered.  Follow-Up: Your physician wants you to follow-up in: one year with Dr. Lovena Le.   You will receive a reminder letter in the mail two months in advance. If you don't receive a letter, please call our office to schedule the follow-up appointment.  Remote monitoring is used to monitor your Pacemaker from home. This monitoring reduces the number of office visits required to check your device to one time per year. It allows [place] to keep an eye on the functioning of your device to ensure it is working properly. You are scheduled for a device check from home on 07/31/2020. You may send your transmission at any time that day. If you have a wireless device, the transmission will be sent automatically. After your physician reviews your transmission, you will receive a postcard with your next transmission date.  Any Other Special Instructions Will Be Listed Below (If Applicable).  If you need a refill on your cardiac medications before your next appointment, please call your pharmacy.

## 2020-05-16 NOTE — Progress Notes (Signed)
HPI [name] returns today for ongoing evaluation and management of paroxysmal atrial arrhythmias.  The patient has a history of atrial flutter and is status post catheter ablation.  He has not had palpitations or syncope and denies chest pain or shortness of breath.  He was subsequently found to have paroxysmal atrial fibrillation on his [employer] pacemaker which had been previously implanted secondary to sinus node dysfunction.  Of note the patient has a history of being on warfarin but was discontinued as he had undergone successful catheter ablation of atrial flutter and had previously not been demonstrated to have atrial fibrillation.  The patient does not have symptoms with his atrial fibrillation and feels well.  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 No Known Allergies   Current Outpatient Medications  Medication Sig Dispense Refill  . atorvastatin (LIPITOR) 20 MG tablet Take 20 mg by mouth at bedtime.  0  . celecoxib (CELEBREX) 200 MG capsule TK 1 C PO QD PRF ARTHRITIS    . Cholecalciferol (VITAMIN D) 2000 units CAPS Take 2,000 Units by mouth daily as needed (leg problems).    . finasteride (PROSCAR) 5 MG tablet Take 5 mg by mouth daily with breakfast.     . furosemide (LASIX) 40 MG tablet TAKE 1 TABLET BY MOUTH EVERY DAY 90 tablet 3  . glimepiride (AMARYL) 4 MG tablet Take 4 mg by mouth daily with breakfast.    . lisinopril (PRINIVIL,ZESTRIL) 20 MG tablet Take 20 mg by mouth daily.     . metFORMIN (GLUCOPHAGE-XR) 500 MG 24 hr tablet Take 1,000 mg by mouth 2 (two) times daily.  0  . montelukast (SINGULAIR) 10 MG tablet TK 1 T PO QD IN THE EVE  3  . tamsulosin (FLOMAX) 0.4 MG CAPS capsule Take 0.8 mg by mouth daily.     Marland Kitchen terbinafine (LAMISIL) 250 MG tablet Take 1 tablet (250 mg total) by mouth daily. 30 tablet 0   No current facility-administered medications for this visit.     Past Medical History:  Diagnosis Date  . BPH (benign  prostatic hyperplasia)   . Chronic diastolic CHF (congestive heart failure) ([place])   . Demand ischemia ([place])    a. elev trop 07/2016 in setting of demand ischemia from CHF/bradycardia - no sig CAD by cath at that time.  . Diabetes mellitus without complication ([place])   . High cholesterol   . Hypertension   . Junctional bradycardia    a. s/p Boston Sci ppm 07/2016.  . Mitral regurgitation   . PAF (paroxysmal atrial fibrillation) ([place])   . Renal insufficiency   . Right ventricular dysfunction   . S/P cardiac pacemaker procedure   . Tricuspid regurgitation     ROS:   All systems reviewed and negative except as noted in the HPI.   Past Surgical History:  Procedure Laterality Date  . A-FLUTTER ABLATION N/A 03/29/2017   Procedure: A-Flutter Ablation;  Surgeon: Nicholas Lance, MD;  Location: [place] [facility];  Service: Cardiovascular;  Laterality: N/A;  . CARDIAC CATHETERIZATION N/A 07/23/2016   Procedure: Right/Left Heart Cath and Coronary Angiography;  Surgeon: Nicholas Dresser, MD;  Location: [place] [facility];  Service: Cardiovascular;  Laterality: N/A;  . EP IMPLANTABLE DEVICE N/A 07/23/2016   Procedure: Pacemaker Implant;  Surgeon: Nicholas Lance, MD;  Location: [place] [facility];  Service: Cardiovascular;  Laterality: N/A;     History reviewed. No pertinent family  history.   Social History   Socioeconomic History  . Marital status: Divorced    Spouse name: Not on file  . Number of children: Not on file  . Years of education: Not on file  . Highest education level: Not on file  Occupational History  . Not on file  Tobacco Use  . Smoking status: Former Research scientist (life sciences)  . Smokeless tobacco: Never Used  Vaping Use  . Vaping Use: Never used  Substance and Sexual Activity  . Alcohol use: No    Comment: 2-3/wk  . Drug use: No  . Sexual activity: Not on file  Other Topics Concern  . Not on file  Social History Narrative  . Not on file   Social Determinants of Health    Financial Resource Strain:   . Difficulty of Paying Living Expenses: Not on file  Food Insecurity:   . Worried About Charity fundraiser in the Last Year: Not on file  . Ran Out of Food in the Last Year: Not on file  Transportation Needs:   . Lack of Transportation (Medical): Not on file  . Lack of Transportation (Non-Medical): Not on file  Physical Activity:   . Days of Exercise per Week: Not on file  . Minutes of Exercise per Session: Not on file  Stress:   . Feeling of Stress : Not on file  Social Connections:   . Frequency of Communication with Friends and Family: Not on file  . Frequency of Social Gatherings with Friends and Family: Not on file  . Attends Religious Services: Not on file  . Active Member of Clubs or Organizations: Not on file  . Attends Archivist Meetings: Not on file  . Marital Status: Not on file  Intimate Partner Violence:   . Fear of Current or Ex-Partner: Not on file  . Emotionally Abused: Not on file  . Physically Abused: Not on file  . Sexually Abused: Not on file     BP 126/60   Pulse (!) 53   Ht 5\' 11"  (1.803 m)   Wt 241 lb 12.8 oz (109.7 kg)   SpO2 96%   BMI 33.72 kg/m   Physical Exam:  Well appearing 79 year old man, NAD HEENT: Unremarkable Neck:  No JVD, no thyromegally Lymphatics:  No adenopathy Back:  No CVA tenderness Lungs:  Clear, with no wheezes, rales, or rhonchi HEART:  Regular rate rhythm, no murmurs, no rubs, no clicks Abd:  soft, positive bowel sounds, no organomegally, no rebound, no guarding Ext:  2 plus pulses, no edema, no cyanosis, no clubbing Skin:  No rashes no nodules Neuro:  CN II through XII intact, motor grossly intact  EKG -normal sinus rhythm with sinus bradycardia and 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  See PaceArt for details.   Assess/Plan: 1.  Paroxysmal atrial fibrillation -I have discussed the treatment options.  He is  asymptomatic but at some increased risk for stroke.  I recommended initiation of Xarelto.  We will obtain a current level of renal function before deciding on 15 versus 20 mg daily. 2.  Sinus node dysfunction -he is stable status post pacemaker insertion. 3.  Pacemaker -his [place] pacemaker is working normally. 4.  Hypertension -his blood pressure today is well controlled.  No change in medical therapy.  Cristopher Peru, MD

## 2020-05-17 ENCOUNTER — Telehealth: Payer: Self-pay

## 2020-05-17 MED ORDER — RIVAROXABAN 20 MG PO TABS: 20.0000 mg | ORAL_TABLET | Freq: Every day | ORAL | 11 refills | Status: AC

## 2020-05-17 NOTE — Telephone Encounter (Signed)
-----   Message from Evans Lance, MD sent at 05/16/2020 10:07 PM EDT ----- He will need 20 mg of xarelto daily

## 2020-05-17 NOTE — Telephone Encounter (Signed)
Call placed to Pt.  Advised per review of lab work-he should start Xarelto 20 mg one tablet by mouth daily with largest meal.  Pt has 30 day free card.  Will leave samples to pick up for new start Xarelto.  All questions answered.

## 2020-06-14 DIAGNOSIS — M545 Low back pain, unspecified: Secondary | ICD-10-CM | POA: Diagnosis not present

## 2020-06-25 DIAGNOSIS — M5441 Lumbago with sciatica, right side: Secondary | ICD-10-CM | POA: Diagnosis not present

## 2020-07-31 ENCOUNTER — Ambulatory Visit (INDEPENDENT_AMBULATORY_CARE_PROVIDER_SITE_OTHER): Payer: Medicare Other

## 2020-07-31 DIAGNOSIS — I495 Sick sinus syndrome: Secondary | ICD-10-CM | POA: Diagnosis not present

## 2020-08-07 LAB — CUP PACEART REMOTE DEVICE CHECK
Battery Remaining Longevity: 132 mo
Battery Remaining Percentage: 100 %
Brady Statistic RA Percent Paced: 50 %
Brady Statistic RV Percent Paced: 86 %
Date Time Interrogation Session: 20211130004500
Implantable Lead Implant Date: 20171116
Implantable Lead Implant Date: 20171116
Implantable Lead Location: 753859
Implantable Lead Location: 753860
Implantable Lead Model: 7741
Implantable Lead Model: 7742
Implantable Lead Serial Number: 743314
Implantable Lead Serial Number: 821967
Implantable Pulse Generator Implant Date: 20171116
Lead Channel Impedance Value: 567 Ohm
Lead Channel Impedance Value: 656 Ohm
Lead Channel Setting Pacing Amplitude: 2 V
Lead Channel Setting Pacing Amplitude: 2.5 V
Lead Channel Setting Pacing Pulse Width: 0.4 ms
Lead Channel Setting Sensing Sensitivity: 2.5 mV
Pulse Gen Serial Number: 770005

## 2020-08-08 DIAGNOSIS — E1165 Type 2 diabetes mellitus with hyperglycemia: Secondary | ICD-10-CM | POA: Diagnosis not present

## 2020-08-08 DIAGNOSIS — I5030 Unspecified diastolic (congestive) heart failure: Secondary | ICD-10-CM | POA: Diagnosis not present

## 2020-08-08 DIAGNOSIS — Z79899 Other long term (current) drug therapy: Secondary | ICD-10-CM | POA: Diagnosis not present

## 2020-08-08 DIAGNOSIS — Z23 Encounter for immunization: Secondary | ICD-10-CM | POA: Diagnosis not present

## 2020-08-08 DIAGNOSIS — Z0000 Encounter for general adult medical examination without abnormal findings: Secondary | ICD-10-CM | POA: Diagnosis not present

## 2020-08-08 DIAGNOSIS — I10 Essential (primary) hypertension: Secondary | ICD-10-CM | POA: Diagnosis not present

## 2020-08-08 DIAGNOSIS — I4811 Longstanding persistent atrial fibrillation: Secondary | ICD-10-CM | POA: Diagnosis not present

## 2020-08-08 DIAGNOSIS — E782 Mixed hyperlipidemia: Secondary | ICD-10-CM | POA: Diagnosis not present

## 2020-08-08 DIAGNOSIS — E1121 Type 2 diabetes mellitus with diabetic nephropathy: Secondary | ICD-10-CM | POA: Diagnosis not present

## 2020-08-08 DIAGNOSIS — E559 Vitamin D deficiency, unspecified: Secondary | ICD-10-CM | POA: Diagnosis not present

## 2020-08-08 NOTE — Progress Notes (Signed)
Remote pacemaker transmission.   

## 2020-11-05 ENCOUNTER — Ambulatory Visit (INDEPENDENT_AMBULATORY_CARE_PROVIDER_SITE_OTHER): Payer: Medicare Other

## 2020-11-05 DIAGNOSIS — R001 Bradycardia, unspecified: Secondary | ICD-10-CM | POA: Diagnosis not present

## 2020-11-06 LAB — CUP PACEART REMOTE DEVICE CHECK
Battery Remaining Longevity: 126 mo
Battery Remaining Percentage: 100 %
Brady Statistic RA Percent Paced: 59 %
Brady Statistic RV Percent Paced: 87 %
Date Time Interrogation Session: 20220301020600
Implantable Lead Implant Date: 20171116
Implantable Lead Implant Date: 20171116
Implantable Lead Location: 753859
Implantable Lead Location: 753860
Implantable Lead Model: 7741
Implantable Lead Model: 7742
Implantable Lead Serial Number: 743314
Implantable Lead Serial Number: 821967
Implantable Pulse Generator Implant Date: 20171116
Lead Channel Impedance Value: 575 Ohm
Lead Channel Impedance Value: 674 Ohm
Lead Channel Setting Pacing Amplitude: 2 V
Lead Channel Setting Pacing Amplitude: 2.5 V
Lead Channel Setting Pacing Pulse Width: 0.4 ms
Lead Channel Setting Sensing Sensitivity: 2.5 mV
Pulse Gen Serial Number: 770005

## 2020-11-13 NOTE — Progress Notes (Signed)
Remote pacemaker transmission.   

## 2021-02-05 ENCOUNTER — Ambulatory Visit (INDEPENDENT_AMBULATORY_CARE_PROVIDER_SITE_OTHER): Payer: Medicare Other

## 2021-02-05 DIAGNOSIS — I495 Sick sinus syndrome: Secondary | ICD-10-CM | POA: Diagnosis not present

## 2021-02-06 LAB — CUP PACEART REMOTE DEVICE CHECK
Battery Remaining Longevity: 108 mo
Battery Remaining Percentage: 100 %
Brady Statistic RA Percent Paced: 56 %
Brady Statistic RV Percent Paced: 88 %
Date Time Interrogation Session: 20220531002200
Implantable Lead Implant Date: 20171116
Implantable Lead Implant Date: 20171116
Implantable Lead Location: 753859
Implantable Lead Location: 753860
Implantable Lead Model: 7741
Implantable Lead Model: 7742
Implantable Lead Serial Number: 743314
Implantable Lead Serial Number: 821967
Implantable Pulse Generator Implant Date: 20171116
Lead Channel Impedance Value: 546 Ohm
Lead Channel Impedance Value: 655 Ohm
Lead Channel Setting Pacing Amplitude: 2 V
Lead Channel Setting Pacing Amplitude: 2.5 V
Lead Channel Setting Pacing Pulse Width: 0.4 ms
Lead Channel Setting Sensing Sensitivity: 2.5 mV
Pulse Gen Serial Number: 770005

## 2021-02-11 DIAGNOSIS — E782 Mixed hyperlipidemia: Secondary | ICD-10-CM | POA: Diagnosis not present

## 2021-02-11 DIAGNOSIS — R351 Nocturia: Secondary | ICD-10-CM | POA: Diagnosis not present

## 2021-02-11 DIAGNOSIS — N401 Enlarged prostate with lower urinary tract symptoms: Secondary | ICD-10-CM | POA: Diagnosis not present

## 2021-02-11 DIAGNOSIS — E1165 Type 2 diabetes mellitus with hyperglycemia: Secondary | ICD-10-CM | POA: Diagnosis not present

## 2021-02-18 DIAGNOSIS — N401 Enlarged prostate with lower urinary tract symptoms: Secondary | ICD-10-CM | POA: Diagnosis not present

## 2021-02-18 DIAGNOSIS — I4811 Longstanding persistent atrial fibrillation: Secondary | ICD-10-CM | POA: Diagnosis not present

## 2021-02-18 DIAGNOSIS — E1121 Type 2 diabetes mellitus with diabetic nephropathy: Secondary | ICD-10-CM | POA: Diagnosis not present

## 2021-02-18 DIAGNOSIS — E782 Mixed hyperlipidemia: Secondary | ICD-10-CM | POA: Diagnosis not present

## 2021-02-18 DIAGNOSIS — I5030 Unspecified diastolic (congestive) heart failure: Secondary | ICD-10-CM | POA: Diagnosis not present

## 2021-02-18 DIAGNOSIS — I10 Essential (primary) hypertension: Secondary | ICD-10-CM | POA: Diagnosis not present

## 2021-02-18 DIAGNOSIS — N1831 Chronic kidney disease, stage 3a: Secondary | ICD-10-CM | POA: Diagnosis not present

## 2021-02-20 DIAGNOSIS — Z20822 Contact with and (suspected) exposure to covid-19: Secondary | ICD-10-CM | POA: Diagnosis not present

## 2021-02-28 NOTE — Progress Notes (Signed)
Remote pacemaker transmission.   

## 2021-05-07 ENCOUNTER — Ambulatory Visit (INDEPENDENT_AMBULATORY_CARE_PROVIDER_SITE_OTHER): Payer: Medicare Other

## 2021-05-07 DIAGNOSIS — I495 Sick sinus syndrome: Secondary | ICD-10-CM

## 2021-05-13 LAB — CUP PACEART REMOTE DEVICE CHECK
Battery Remaining Longevity: 120 mo
Battery Remaining Percentage: 100 %
Brady Statistic RA Percent Paced: 54 %
Brady Statistic RV Percent Paced: 88 %
Date Time Interrogation Session: 20220831002900
Implantable Lead Implant Date: 20171116
Implantable Lead Implant Date: 20171116
Implantable Lead Location: 753859
Implantable Lead Location: 753860
Implantable Lead Model: 7741
Implantable Lead Model: 7742
Implantable Lead Serial Number: 743314
Implantable Lead Serial Number: 821967
Implantable Pulse Generator Implant Date: 20171116
Lead Channel Impedance Value: 546 Ohm
Lead Channel Impedance Value: 630 Ohm
Lead Channel Setting Pacing Amplitude: 2 V
Lead Channel Setting Pacing Amplitude: 2.5 V
Lead Channel Setting Pacing Pulse Width: 0.4 ms
Lead Channel Setting Sensing Sensitivity: 2.5 mV
Pulse Gen Serial Number: 770005

## 2021-05-20 NOTE — Progress Notes (Signed)
Remote pacemaker transmission.   

## 2021-06-17 DIAGNOSIS — Z20822 Contact with and (suspected) exposure to covid-19: Secondary | ICD-10-CM | POA: Diagnosis not present

## 2021-07-14 ENCOUNTER — Other Ambulatory Visit: Payer: Self-pay | Admitting: *Deleted

## 2021-07-14 MED ORDER — FUROSEMIDE 40 MG PO TABS: 40.0000 mg | ORAL_TABLET | Freq: Every day | ORAL | 0 refills | Status: DC

## 2021-08-05 ENCOUNTER — Other Ambulatory Visit: Payer: Self-pay | Admitting: Internal Medicine

## 2021-08-06 ENCOUNTER — Ambulatory Visit (INDEPENDENT_AMBULATORY_CARE_PROVIDER_SITE_OTHER): Payer: Medicare Other

## 2021-08-06 DIAGNOSIS — I495 Sick sinus syndrome: Secondary | ICD-10-CM

## 2021-08-06 LAB — CUP PACEART REMOTE DEVICE CHECK
Battery Remaining Longevity: 102 mo
Battery Remaining Percentage: 100 %
Brady Statistic RA Percent Paced: 49 %
Brady Statistic RV Percent Paced: 89 %
Date Time Interrogation Session: 20221130002100
Implantable Lead Implant Date: 20171116
Implantable Lead Implant Date: 20171116
Implantable Lead Location: 753859
Implantable Lead Location: 753860
Implantable Lead Model: 7741
Implantable Lead Model: 7742
Implantable Lead Serial Number: 743314
Implantable Lead Serial Number: 821967
Implantable Pulse Generator Implant Date: 20171116
Lead Channel Impedance Value: 557 Ohm
Lead Channel Impedance Value: 658 Ohm
Lead Channel Pacing Threshold Amplitude: 0.6 V
Lead Channel Pacing Threshold Pulse Width: 0.4 ms
Lead Channel Setting Pacing Amplitude: 2 V
Lead Channel Setting Pacing Amplitude: 2.5 V
Lead Channel Setting Pacing Pulse Width: 0.4 ms
Lead Channel Setting Sensing Sensitivity: 2.5 mV
Pulse Gen Serial Number: 770005

## 2021-08-15 NOTE — Progress Notes (Signed)
Remote pacemaker transmission.   

## 2021-09-25 ENCOUNTER — Telehealth: Payer: Self-pay | Admitting: Internal Medicine

## 2021-09-25 NOTE — Telephone Encounter (Signed)
Patient was told he needed  a f/u with Dr. Lovena Le, before he could get a refill on Lasix.   Schedule to see MD on 11/28/21.   Need rx sent to [facility] on [facility].

## 2021-10-21 ENCOUNTER — Other Ambulatory Visit: Payer: Self-pay

## 2021-10-21 MED ORDER — FUROSEMIDE 40 MG PO TABS: 40.0000 mg | ORAL_TABLET | Freq: Every day | ORAL | 0 refills | Status: DC

## 2021-11-05 ENCOUNTER — Ambulatory Visit (INDEPENDENT_AMBULATORY_CARE_PROVIDER_SITE_OTHER): Payer: Medicare Other

## 2021-11-05 DIAGNOSIS — I495 Sick sinus syndrome: Secondary | ICD-10-CM | POA: Diagnosis not present

## 2021-11-05 LAB — CUP PACEART REMOTE DEVICE CHECK
Battery Remaining Longevity: 78 mo
Battery Remaining Percentage: 78 %
Brady Statistic RA Percent Paced: 32 %
Brady Statistic RV Percent Paced: 88 %
Date Time Interrogation Session: 20230301003400
Implantable Lead Implant Date: 20171116
Implantable Lead Implant Date: 20171116
Implantable Lead Location: 753859
Implantable Lead Location: 753860
Implantable Lead Model: 7741
Implantable Lead Model: 7742
Implantable Lead Serial Number: 743314
Implantable Lead Serial Number: 821967
Implantable Pulse Generator Implant Date: 20171116
Lead Channel Impedance Value: 561 Ohm
Lead Channel Impedance Value: 654 Ohm
Lead Channel Setting Pacing Amplitude: 2 V
Lead Channel Setting Pacing Amplitude: 2.5 V
Lead Channel Setting Pacing Pulse Width: 0.4 ms
Lead Channel Setting Sensing Sensitivity: 2.5 mV
Pulse Gen Serial Number: 770005

## 2021-11-12 NOTE — Progress Notes (Signed)
Remote pacemaker transmission.   

## 2021-11-28 ENCOUNTER — Encounter: Payer: Medicare Other | Admitting: Internal Medicine

## 2021-11-28 ENCOUNTER — Other Ambulatory Visit: Payer: Self-pay | Admitting: Internal Medicine

## 2021-12-09 ENCOUNTER — Telehealth: Payer: Self-pay

## 2021-12-09 NOTE — Telephone Encounter (Signed)
Repeat BSC alert for Atrial burden ?Presenting rhythm AF/flutter ongoing from 1/11, controlled ventricular rates ?Burden 36%, Xarelto '20mg'$  ? ?Attempted to call Pt. Call went to VM, Mailbox was full. ? ?Pt has not been seen in office since 05/2020.  Appears his atrial fibrillation burden has been increasing. ? ?Pt needs sooner f/u to evaluate for symptoms.  ? ? ? ? ? ? ? ?

## 2021-12-31 DIAGNOSIS — R58 Hemorrhage, not elsewhere classified: Secondary | ICD-10-CM | POA: Diagnosis not present

## 2022-01-05 DIAGNOSIS — 419620001 Death: Secondary | SNOMED CT | POA: Diagnosis not present

## 2022-01-05 DEATH — deceased

## 2022-01-06 ENCOUNTER — Telehealth: Payer: Self-pay | Admitting: Internal Medicine

## 2022-01-06 NOTE — Telephone Encounter (Signed)
New Message: ? ? ? ?Nicholas Harris called and wanted Dr Lovena Le to know that patient passed away on 2022/01/24 from a GI bleed. ?

## 2022-01-27 ENCOUNTER — Encounter: Payer: Medicare Other | Admitting: Internal Medicine

## 2022-02-16 ENCOUNTER — Encounter: Payer: Medicare Other | Admitting: Internal Medicine
